# Patient Record
Sex: Male | Born: 1945 | Race: White | Hispanic: No | Marital: Married | Smoking: Former smoker
Health system: Southern US, Community
[De-identification: ages and names within clinical notes are randomized; demographics above are authoritative.]

## PROBLEM LIST (undated history)

## (undated) DIAGNOSIS — K449 Diaphragmatic hernia without obstruction or gangrene: Secondary | ICD-10-CM

## (undated) DIAGNOSIS — R42 Dizziness and giddiness: Secondary | ICD-10-CM

## (undated) DIAGNOSIS — E7800 Pure hypercholesterolemia, unspecified: Secondary | ICD-10-CM

## (undated) HISTORY — PX: BACK SURGERY: SHX140

## (undated) HISTORY — PX: SHOULDER SURGERY: SHX246

## (undated) HISTORY — PX: KNEE SURGERY: SHX244

---

## 1999-01-16 ENCOUNTER — Ambulatory Visit (HOSPITAL_COMMUNITY): Admission: RE | Admit: 1999-01-16 | Discharge: 1999-01-16 | Payer: Self-pay | Admitting: Internal Medicine

## 1999-04-17 ENCOUNTER — Ambulatory Visit (HOSPITAL_COMMUNITY): Admission: RE | Admit: 1999-04-17 | Discharge: 1999-04-17 | Payer: Self-pay | Admitting: *Deleted

## 2001-07-25 ENCOUNTER — Encounter (INDEPENDENT_AMBULATORY_CARE_PROVIDER_SITE_OTHER): Payer: Self-pay | Admitting: *Deleted

## 2001-07-25 ENCOUNTER — Other Ambulatory Visit: Admission: RE | Admit: 2001-07-25 | Discharge: 2001-07-25 | Payer: Self-pay | Admitting: Oncology

## 2005-09-05 ENCOUNTER — Ambulatory Visit: Payer: Self-pay | Admitting: Oncology

## 2006-09-03 ENCOUNTER — Ambulatory Visit: Payer: Self-pay | Admitting: Oncology

## 2006-09-05 LAB — CBC WITH DIFFERENTIAL/PLATELET
BASO%: 0.3 % (ref 0.0–2.0)
Eosinophils Absolute: 0.2 10*3/uL (ref 0.0–0.5)
LYMPH%: 18.3 % (ref 14.0–48.0)
MCHC: 35 g/dL (ref 32.0–35.9)
MONO#: 0.4 10*3/uL (ref 0.1–0.9)
NEUT#: 3.7 10*3/uL (ref 1.5–6.5)
RBC: 5.22 10*6/uL (ref 4.20–5.71)
RDW: 12 % (ref 11.2–14.6)
WBC: 5.3 10*3/uL (ref 4.0–10.0)
lymph#: 1 10*3/uL (ref 0.9–3.3)

## 2007-09-02 ENCOUNTER — Ambulatory Visit: Payer: Self-pay | Admitting: Oncology

## 2007-09-04 LAB — CBC WITH DIFFERENTIAL/PLATELET
BASO%: 0.4 % (ref 0.0–2.0)
Eosinophils Absolute: 0.3 10*3/uL (ref 0.0–0.5)
HCT: 44 % (ref 38.7–49.9)
HGB: 15.8 g/dL (ref 13.0–17.1)
MCHC: 36 g/dL — ABNORMAL HIGH (ref 32.0–35.9)
MONO#: 0.4 10*3/uL (ref 0.1–0.9)
NEUT#: 3.5 10*3/uL (ref 1.5–6.5)
NEUT%: 67.3 % (ref 40.0–75.0)
WBC: 5.2 10*3/uL (ref 4.0–10.0)
lymph#: 1 10*3/uL (ref 0.9–3.3)

## 2007-09-04 LAB — IGG, IGA, IGM: IgM, Serum: 16 mg/dL — ABNORMAL LOW (ref 60–263)

## 2008-08-31 ENCOUNTER — Ambulatory Visit: Payer: Self-pay | Admitting: Oncology

## 2008-09-03 LAB — CBC WITH DIFFERENTIAL/PLATELET
BASO%: 0.3 % (ref 0.0–2.0)
Basophils Absolute: 0 10*3/uL (ref 0.0–0.1)
EOS%: 3.9 % (ref 0.0–7.0)
HCT: 45.3 % (ref 38.7–49.9)
HGB: 15.7 g/dL (ref 13.0–17.1)
MCH: 31.7 pg (ref 28.0–33.4)
MONO#: 0.3 10*3/uL (ref 0.1–0.9)
NEUT%: 71.5 % (ref 40.0–75.0)
RDW: 12.2 % (ref 11.2–14.6)
WBC: 4.7 10*3/uL (ref 4.0–10.0)
lymph#: 0.8 10*3/uL — ABNORMAL LOW (ref 0.9–3.3)

## 2008-09-03 LAB — IGG, IGA, IGM
IgA: 38 mg/dL — ABNORMAL LOW (ref 68–378)
IgM, Serum: 13 mg/dL — ABNORMAL LOW (ref 60–263)

## 2009-09-07 ENCOUNTER — Ambulatory Visit: Payer: Self-pay | Admitting: Oncology

## 2009-09-09 LAB — CBC WITH DIFFERENTIAL/PLATELET
BASO%: 0.6 % (ref 0.0–2.0)
EOS%: 3.9 % (ref 0.0–7.0)
MCH: 30.9 pg (ref 27.2–33.4)
MCHC: 34.9 g/dL (ref 32.0–36.0)
MONO#: 0.4 10*3/uL (ref 0.1–0.9)
NEUT%: 70.5 % (ref 39.0–75.0)
RBC: 5.14 10*6/uL (ref 4.20–5.82)
RDW: 12.1 % (ref 11.0–14.6)
WBC: 5.4 10*3/uL (ref 4.0–10.3)
lymph#: 1 10*3/uL (ref 0.9–3.3)

## 2010-09-04 ENCOUNTER — Ambulatory Visit: Payer: Self-pay | Admitting: Oncology

## 2010-09-07 LAB — CBC WITH DIFFERENTIAL/PLATELET
BASO%: 0.3 % (ref 0.0–2.0)
EOS%: 4.3 % (ref 0.0–7.0)
HCT: 46.8 % (ref 38.4–49.9)
MCH: 31.1 pg (ref 27.2–33.4)
MCHC: 34.3 g/dL (ref 32.0–36.0)
MONO#: 0.4 10*3/uL (ref 0.1–0.9)
NEUT%: 71.2 % (ref 39.0–75.0)
RDW: 13.1 % (ref 11.0–14.6)
WBC: 4.9 10*3/uL (ref 4.0–10.3)
lymph#: 0.8 10*3/uL — ABNORMAL LOW (ref 0.9–3.3)

## 2010-09-08 LAB — IGG, IGA, IGM
IgA: 42 mg/dL — ABNORMAL LOW (ref 68–378)
IgG (Immunoglobin G), Serum: 498 mg/dL — ABNORMAL LOW (ref 694–1618)
IgM, Serum: 15 mg/dL — ABNORMAL LOW (ref 60–263)

## 2011-09-05 ENCOUNTER — Encounter (HOSPITAL_BASED_OUTPATIENT_CLINIC_OR_DEPARTMENT_OTHER): Payer: 59 | Admitting: Oncology

## 2011-09-05 ENCOUNTER — Other Ambulatory Visit: Payer: Self-pay | Admitting: Oncology

## 2011-09-05 DIAGNOSIS — D696 Thrombocytopenia, unspecified: Secondary | ICD-10-CM

## 2011-09-05 DIAGNOSIS — D801 Nonfamilial hypogammaglobulinemia: Secondary | ICD-10-CM

## 2011-09-05 LAB — CBC WITH DIFFERENTIAL/PLATELET
BASO%: 0.3 % (ref 0.0–2.0)
HCT: 47.3 % (ref 38.4–49.9)
MCHC: 34.4 g/dL (ref 32.0–36.0)
MONO#: 0.3 10*3/uL (ref 0.1–0.9)
NEUT#: 3.5 10*3/uL (ref 1.5–6.5)
RBC: 5.2 10*6/uL (ref 4.20–5.82)
WBC: 4.9 10*3/uL (ref 4.0–10.3)
lymph#: 0.9 10*3/uL (ref 0.9–3.3)

## 2011-09-06 LAB — IGG, IGA, IGM
IgG (Immunoglobin G), Serum: 437 mg/dL — ABNORMAL LOW (ref 650–1600)
IgM, Serum: 17 mg/dL — ABNORMAL LOW (ref 41–251)

## 2011-12-08 ENCOUNTER — Telehealth: Payer: Self-pay | Admitting: Oncology

## 2011-12-08 NOTE — Telephone Encounter (Signed)
Talked to pt, gave him appt date for 01/03/12 ,4 mos lab draw, he asked that lan for 1/10 be cancelled. He had a physical and labs were back to normal. He is faxing result to Dr Truett Perna. Gave pt appt for lab and md for September 2013

## 2012-01-03 ENCOUNTER — Other Ambulatory Visit: Payer: 59

## 2012-09-02 ENCOUNTER — Other Ambulatory Visit: Payer: Self-pay | Admitting: *Deleted

## 2012-09-02 ENCOUNTER — Other Ambulatory Visit: Payer: 59 | Admitting: Lab

## 2012-09-02 ENCOUNTER — Telehealth: Payer: Self-pay | Admitting: *Deleted

## 2012-09-02 ENCOUNTER — Ambulatory Visit: Payer: 59 | Admitting: Oncology

## 2012-09-02 NOTE — Telephone Encounter (Signed)
Left message with male at home # requesting pt call office. (Failed to keep appt today. Order sent to schedulers to r/s appt.)

## 2012-09-03 ENCOUNTER — Telehealth: Payer: Self-pay | Admitting: *Deleted

## 2012-09-03 NOTE — Telephone Encounter (Signed)
Called and spoke with pt regarding missed appt 09/02/12.  Pt stated "I simply forgot" and apologized. Referred pt to scheduling to make future appt.

## 2012-11-04 ENCOUNTER — Ambulatory Visit: Payer: 59 | Admitting: Oncology

## 2015-01-10 DIAGNOSIS — J019 Acute sinusitis, unspecified: Secondary | ICD-10-CM | POA: Diagnosis not present

## 2015-01-19 DIAGNOSIS — R42 Dizziness and giddiness: Secondary | ICD-10-CM | POA: Diagnosis not present

## 2015-01-19 DIAGNOSIS — Z125 Encounter for screening for malignant neoplasm of prostate: Secondary | ICD-10-CM | POA: Diagnosis not present

## 2015-01-19 DIAGNOSIS — Z1211 Encounter for screening for malignant neoplasm of colon: Secondary | ICD-10-CM | POA: Diagnosis not present

## 2015-01-19 DIAGNOSIS — I70219 Atherosclerosis of native arteries of extremities with intermittent claudication, unspecified extremity: Secondary | ICD-10-CM | POA: Diagnosis not present

## 2015-01-19 DIAGNOSIS — R1032 Left lower quadrant pain: Secondary | ICD-10-CM | POA: Diagnosis not present

## 2015-01-19 DIAGNOSIS — Z131 Encounter for screening for diabetes mellitus: Secondary | ICD-10-CM | POA: Diagnosis not present

## 2015-01-19 DIAGNOSIS — H9113 Presbycusis, bilateral: Secondary | ICD-10-CM | POA: Diagnosis not present

## 2015-01-19 DIAGNOSIS — E784 Other hyperlipidemia: Secondary | ICD-10-CM | POA: Diagnosis not present

## 2015-01-19 DIAGNOSIS — Z0000 Encounter for general adult medical examination without abnormal findings: Secondary | ICD-10-CM | POA: Diagnosis not present

## 2015-01-19 DIAGNOSIS — Z79899 Other long term (current) drug therapy: Secondary | ICD-10-CM | POA: Diagnosis not present

## 2015-01-19 DIAGNOSIS — E780 Pure hypercholesterolemia: Secondary | ICD-10-CM | POA: Diagnosis not present

## 2015-01-19 DIAGNOSIS — H8309 Labyrinthitis, unspecified ear: Secondary | ICD-10-CM | POA: Diagnosis not present

## 2015-01-19 DIAGNOSIS — Z23 Encounter for immunization: Secondary | ICD-10-CM | POA: Diagnosis not present

## 2015-01-19 DIAGNOSIS — Z1389 Encounter for screening for other disorder: Secondary | ICD-10-CM | POA: Diagnosis not present

## 2015-01-19 DIAGNOSIS — R002 Palpitations: Secondary | ICD-10-CM | POA: Diagnosis not present

## 2015-01-19 DIAGNOSIS — R5383 Other fatigue: Secondary | ICD-10-CM | POA: Diagnosis not present

## 2018-01-22 ENCOUNTER — Other Ambulatory Visit: Payer: Self-pay | Admitting: Internal Medicine

## 2018-01-22 DIAGNOSIS — R1310 Dysphagia, unspecified: Secondary | ICD-10-CM

## 2018-01-24 ENCOUNTER — Ambulatory Visit
Admission: RE | Admit: 2018-01-24 | Discharge: 2018-01-24 | Disposition: A | Discharge status: Home / Self Care | Payer: Medicare Other | Source: Ambulatory Visit | Attending: Internal Medicine | Admitting: Internal Medicine

## 2018-01-24 DIAGNOSIS — R1310 Dysphagia, unspecified: Secondary | ICD-10-CM

## 2019-12-22 ENCOUNTER — Encounter (HOSPITAL_COMMUNITY): Payer: Self-pay | Admitting: Emergency Medicine

## 2019-12-22 ENCOUNTER — Emergency Department (HOSPITAL_COMMUNITY)
Admission: EM | Admit: 2019-12-22 | Discharge: 2019-12-23 | Disposition: A | Discharge status: Home / Self Care | Payer: Medicare Other | Attending: Emergency Medicine | Admitting: Emergency Medicine

## 2019-12-22 ENCOUNTER — Other Ambulatory Visit: Payer: Self-pay

## 2019-12-22 DIAGNOSIS — R42 Dizziness and giddiness: Secondary | ICD-10-CM | POA: Insufficient documentation

## 2019-12-22 HISTORY — DX: Pure hypercholesterolemia, unspecified: E78.00

## 2019-12-22 HISTORY — DX: Dizziness and giddiness: R42

## 2019-12-22 HISTORY — DX: Diaphragmatic hernia without obstruction or gangrene: K44.9

## 2019-12-22 LAB — BASIC METABOLIC PANEL
Anion gap: 7 (ref 5–15)
BUN: 15 mg/dL (ref 8–23)
CO2: 28 mmol/L (ref 22–32)
Calcium: 9.9 mg/dL (ref 8.9–10.3)
Chloride: 107 mmol/L (ref 98–111)
Creatinine, Ser: 1.01 mg/dL (ref 0.61–1.24)
GFR calc Af Amer: >60 mL/min (ref >60)
GFR calc non Af Amer: >60 mL/min (ref >60)
Glucose, Bld: 139 mg/dL — ABNORMAL HIGH (ref 70–99)
Potassium: 4.5 mmol/L (ref 3.5–5.1)
Sodium: 142 mmol/L (ref 135–145)

## 2019-12-22 LAB — CBC
HCT: 50.4 % (ref 39.0–52.0)
Hemoglobin: 16.3 g/dL (ref 13.0–17.0)
MCH: 30.3 pg (ref 26.0–34.0)
MCHC: 32.3 g/dL (ref 30.0–36.0)
MCV: 93.7 fL (ref 80.0–100.0)
Platelets: 70 10*3/uL — ABNORMAL LOW (ref 150–400)
RBC: 5.38 MIL/uL (ref 4.22–5.81)
RDW: 11.9 % (ref 11.5–15.5)
WBC: 8 10*3/uL (ref 4.0–10.5)
nRBC: 0 % (ref 0.0–0.2)

## 2019-12-22 LAB — URINALYSIS, ROUTINE W REFLEX MICROSCOPIC
Bacteria, UA: NONE SEEN
Bilirubin Urine: NEGATIVE
Glucose, UA: NEGATIVE mg/dL
Hgb urine dipstick: NEGATIVE
Ketones, ur: NEGATIVE mg/dL
Nitrite: NEGATIVE
Protein, ur: NEGATIVE mg/dL
Specific Gravity, Urine: 1.009 (ref 1.005–1.030)
pH: 7 (ref 5.0–8.0)

## 2019-12-22 MED ORDER — SODIUM CHLORIDE 0.9% FLUSH: 3.0000 mL | Freq: Once | INTRAVENOUS | Status: AC

## 2019-12-22 NOTE — ED Notes (Signed)
Nurse Navigator spoke with daughter and she would like to make sure we are aware of pt has had some double vision as well as dizziness.

## 2019-12-22 NOTE — ED Triage Notes (Signed)
Pt had episode of dizziness on Sunday that resolved.  Hx of vertigo "a long time ago".  Today felt dizzy and had syncopal episode.  Slid down to ground.  Denies any injury.  Pt has history of hiatal hernia and had just ate prior to syncopal event.  Reports nausea and vomited x 1.  EMS administered Zofran 8mg  IV.

## 2019-12-23 ENCOUNTER — Emergency Department (HOSPITAL_COMMUNITY): Payer: Medicare Other

## 2019-12-23 MED ORDER — GADOBUTROL 1 MMOL/ML IV SOLN
9.0000 mL | Freq: Once | INTRAVENOUS | Status: AC | PRN
  Administered 2019-12-23: 9 mL via INTRAVENOUS

## 2019-12-23 MED ORDER — MECLIZINE HCL 12.5 MG PO TABS: 12.5000 mg | ORAL_TABLET | Freq: Three times a day (TID) | ORAL | 0 refills | Status: DC | PRN

## 2019-12-23 NOTE — ED Provider Notes (Signed)
[facility] Provider Note   CSN: [id number] Arrival date & time: 12/22/19  1434     History Chief Complaint  Patient presents with  . Loss of Consciousness  . Dizziness    MANN SKAGGS is a 73 y.o. male.  The history is provided by the patient.  Dizziness Quality:  Room spinning Severity:  Severe Onset quality:  Sudden Duration:  4 hours Timing:  Constant Progression:  Resolved Chronicity:  Recurrent Context comment:  Change in position Relieved by:  None tried Worsened by:  Nothing Associated symptoms: nausea and vomiting   Associated symptoms: no chest pain, no headaches, no shortness of breath and no vision changes   Patient with history of hyperlipidemia, ITP presents with episode of vertigo.  Patient reports recent history of vertigo that will occur at random, but also when he is turning his head. Today he had an episode with change of position that caused him to fall into his wife.  He reports the episode of dizziness lasted for up to 4 hours.  He reports this is the longest and most severe episode. Denies any traumatic injury from the fall Denies any new visual changes.  No focal weakness.  He did vomit after the dizziness started.  No chest pain or shortness of breath.  Denies history of stroke     Past Medical History:  Diagnosis Date  . Hiatal hernia   . High cholesterol   . Vertigo     There are no problems to display for this patient.   Past Surgical History:  Procedure Laterality Date  . BACK SURGERY    . KNEE SURGERY    . SHOULDER SURGERY         No family history on file.  Social History   Tobacco Use  . Smoking status: Never Smoker  . Smokeless tobacco: Never Used  Substance Use Topics  . Alcohol use: Not Currently  . Drug use: Never    Home Medications Prior to Admission medications   Not on File    Allergies    Patient has no allergy information on record.  Review of Systems     Review of Systems  Constitutional: Negative for fever.  Respiratory: Negative for shortness of breath.   Cardiovascular: Negative for chest pain.  Gastrointestinal: Positive for nausea and vomiting.  Neurological: Positive for dizziness. Negative for headaches.  All other systems reviewed and are negative.   Physical Exam Updated Vital Signs BP (!) 153/91   Pulse 65   Temp 97.8 F (36.6 C) (Oral)   Resp 15   SpO2 95%   Physical Exam CONSTITUTIONAL: Elderly, no distress HEAD: Normocephalic/atraumatic EYES: EOMI/PERRL, no nystagmus, no visual field deficit  no ptosis ENMT: Mucous membranes moist NECK: supple no meningeal signs, no bruits CV: S1/S2 noted, no murmurs/rubs/gallops noted LUNGS: Lungs are clear to auscultation bilaterally, no apparent distress ABDOMEN: soft, nontender NEURO:Awake/alert, face symmetric, no arm or leg drift is noted Equal 5/5 strength with shoulder abduction, elbow flex/extension, wrist flex/extension in upper extremities and equal hand grips bilaterally Equal 5/5 strength with hip flexion,knee flex/extension, foot dorsi/plantar flexion Cranial nerves 3/4/5/6/07/01/09/11/12 tested and intact Gait normal without ataxia No past pointing Sensation to light touch intact in all extremities EXTREMITIES: pulses normal, full ROM, no visible trauma SKIN: warm, color normal PSYCH: no abnormalities of mood noted  ED Results / Procedures / Treatments   Labs (all labs ordered are listed, but only abnormal results are displayed) Labs  Reviewed  BASIC METABOLIC PANEL - Abnormal; Notable for the following components:      Result Value   Glucose, Bld 139 (*)    All other components within normal limits  CBC - Abnormal; Notable for the following components:   Platelets 70 (*)    All other components within normal limits  URINALYSIS, ROUTINE W REFLEX MICROSCOPIC - Abnormal; Notable for the following components:   Leukocytes,Ua TRACE (*)    All other components  within normal limits    EKG EKG Interpretation  Date/Time:  Tuesday December 22 2019 14:44:42 EST Ventricular Rate:  67 PR Interval:  216 QRS Duration: 112 QT Interval:  406 QTC Calculation: 429 R Axis:   34 Text Interpretation: Sinus rhythm with 1st degree A-V block Otherwise normal ECG No previous ECGs available Confirmed by Zadie Rhine ([id number]) on 12/23/2019 12:37:25 AM   Radiology No results found.  Procedures Procedures   Medications Ordered in ED Medications  sodium chloride flush (NS) 0.9 % injection 3 mL (3 mLs Intravenous Given by EMS 12/23/19 3545)    ED Course  I have reviewed the triage vital signs and the nursing notes.  Pertinent labs   results that were available during my care of the patient were reviewed by me and considered in my medical decision making (see chart for details).    MDM Rules/Calculators/A&P                      1:00 AM Patient presents after a significant episode of vertigo that caused him to fall. He reports he has had this recently with increasing frequency. Concerned about occult stroke or even vertebrobasilar insufficiency Discussed the case with neuroradiologist Dr. Phill Myron, he recommends MRA head without contrast, MRA neck with and without contrast At this time patient is feeling improved and no focal weakness. EKG shows mild first-degree block but no other acute findings He does have a history of thrombocytopenia, but otherwise labs unremarkable 6:48 AM Patient stable, awaiting MRI imaging. No new complaints. Will sign out to Dr. Silverio Lay at shift change to follow-up MRI Final Clinical Impression(s) / ED Diagnoses Final diagnoses:  None    Rx / DC Orders ED Discharge Orders    None       Zadie Rhine, MD 12/23/19 [phone]

## 2019-12-23 NOTE — ED Notes (Signed)
Patient's daughters: Gonzella Lex VOHYWV : Marthenia Rolling - requesting update on patient's condition .

## 2019-12-23 NOTE — ED Notes (Signed)
Patient transported to MRI 

## 2019-12-23 NOTE — ED Notes (Addendum)
Patient is resting comfortably. 

## 2019-12-23 NOTE — ED Notes (Signed)
Patient verbalizes understanding of discharge instructions. Opportunity for questioning and answers were provided. Armband removed by staff, pt discharged from ED.  

## 2019-12-23 NOTE — ED Provider Notes (Signed)
  Physical Exam  BP 133/79   Pulse 64   Temp 97.8 F (36.6 C) (Oral)   Resp 18   Ht 5\' 11"  (1.803 m)   Wt 90.7 kg   SpO2 98%   BMI 27.89 kg/m   Physical Exam  ED Course/Procedures     Procedures  MDM  Care assumed from Dr. Christy Gentles at 7 AM.  Patient has been having dizzy episodes and was worse yesterday.  He has nonfocal neuro exam at that time an MRI brain and MRA head and neck were ordered.   11:14 AM MRI showed previous strokes. Patient has hx of ITP so will hold off on any antiplatelet agents.  He has multiple family members who have died of massive stroke so he is very concerned.  We will start meclizine for symptomatic relief and refer him to neurology.    Drenda Freeze, MD 12/23/19 [phone]

## 2019-12-23 NOTE — Discharge Instructions (Addendum)
Take meclizine as needed for dizziness  You have previous strokes on the MRI but none recently   See neurologist for follow up   Return to ER if you have worse dizziness, trouble speaking, weakness

## 2019-12-23 NOTE — ED Notes (Addendum)
Vital signs stable. 

## 2019-12-30 ENCOUNTER — Ambulatory Visit
Admission: RE | Admit: 2019-12-30 | Discharge: 2019-12-30 | Disposition: A | Discharge status: Home / Self Care | Payer: Medicare Other | Source: Ambulatory Visit | Attending: Chiropractor | Admitting: Chiropractor

## 2019-12-30 ENCOUNTER — Other Ambulatory Visit: Payer: Self-pay | Admitting: Chiropractor

## 2019-12-30 DIAGNOSIS — M542 Cervicalgia: Secondary | ICD-10-CM

## 2020-01-21 ENCOUNTER — Ambulatory Visit: Payer: Medicare Other

## 2020-01-30 ENCOUNTER — Ambulatory Visit: Payer: Medicare Other | Attending: Internal Medicine

## 2020-01-30 DIAGNOSIS — Z23 Encounter for immunization: Secondary | ICD-10-CM | POA: Insufficient documentation

## 2020-01-30 NOTE — Progress Notes (Signed)
   Covid-19 Vaccination Clinic  Name:  Anthony Reilly    MRN: [id number] DOB: 1946/12/14  01/30/2020  [name] was observed post Covid-19 immunization for 15 minutes without incidence. He was provided with Vaccine Information Sheet and instruction to access the V-Safe system.   [name] was instructed to call 911 with any severe reactions post vaccine: Marland Kitchen Difficulty breathing  . Swelling of your face and throat  . A fast heartbeat  . A bad rash all over your body  . Dizziness and weakness    Immunizations Administered    Name Date Dose VIS Date Route   Pfizer COVID-19 Vaccine 01/30/2020 10:14 AM 0.3 mL 12/04/2019 Intramuscular   Manufacturer: [employer], [employer]   Lot: [id number]   NDC: [address]-5

## 2020-02-04 ENCOUNTER — Ambulatory Visit: Payer: Medicare Other | Admitting: Neurology

## 2020-02-04 ENCOUNTER — Other Ambulatory Visit: Payer: Self-pay

## 2020-02-04 ENCOUNTER — Encounter: Payer: Self-pay | Admitting: Neurology

## 2020-02-04 VITALS — Ht 71.0 in | Wt 204.0 lb

## 2020-02-04 DIAGNOSIS — D693 Immune thrombocytopenic purpura: Secondary | ICD-10-CM | POA: Diagnosis not present

## 2020-02-04 DIAGNOSIS — R42 Dizziness and giddiness: Secondary | ICD-10-CM | POA: Diagnosis not present

## 2020-02-04 DIAGNOSIS — I6381 Other cerebral infarction due to occlusion or stenosis of small artery: Secondary | ICD-10-CM | POA: Diagnosis not present

## 2020-02-04 NOTE — Progress Notes (Signed)
[facility]    Provider:  Dr Lucia Gaskins Requesting Provider: Emergency Room Primary Care Provider:  Burton Apley, MD  CC:  dizziness  HPI:  Anthony Reilly is a 74 y.o. male here as requested by Burton Apley, MD for dizzines. PMHx vertigo, high cholesterol. Patient having dizzy spells, was seen at th emergency room and MRA showed remote strokes. Vertigo started 5-7 years ago he bent down and the room started spinning, since then he has had multiple episodes one most recently December 29th which was severe and sent him to the emergency room. He was started on Plavix recently after stroke found on MRI of the brain.  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  He is worn glasses for over 30 years, 18 months ago he began having occasional dizzy spells about the time I received new prescription glasses from the [place], glasses never seemed right, his first 2020 series vertigo incident occurred in January February, he was seated at a card table within a matter minute he suddenly developed blurred vision dizziness, he was recovered in about 30 minutes, since then he has had a dozen or more dizzy spells of varying degrees, usually occurring when he stood from a seated position but not always, at least twice of occurred when he cut his eyes sharply to the left while checking for oncoming vehicular traffic, resolved in 10 minutes or less, sometime around mid December 2020 he was reading for a book he was wearing his glasses, he gaze across the room experienced  severe dizziness, corrected itself than 10 minutes, on Sunday, December 26 he experienced a mild case of dizziness just after he sat down in a chair which subsided in less than 5 minutes is worse vertigo attack by far was on Tuesday, December 22, 2019, he stood up, immediately experienced severe dizziness and started across the room and grabbed a TV consult for support, everything was spinning, he fell hard to the floor, paramedic was called, his blood pressure was elevated, they were transported to the [facility] ER, MRI of the brain showed a "silent stroke" he was actually told there were multiple of them in the emergency room.  We also reviewed all the information above during the appointment, patient also states in the appointment today he is never experienced anybody weakness, focal weakness, and is concerned because strokes run on his mother side as do Alzheimer's disease, also maternal uncles died from strokes at age 29 and 46.  He also reports he was diagnosed with ITP several years ago, he has some muscular neck disease, takes Aleve to combat the neck soreness, he is currently seeing chiropractor for this.  On January 2021 he had an eye exam, and they told him that it may be due to his eyeglasses and he needed a "prism", since those new classes he has had no incidence of vertigo and feels as though this may have been contributory as well as his blood pressure medication which has been changed. No other focal neurologic deficits,  associated symptoms, inciting events or modifiable factors.  Reviewed notes, labs and imaging from outside physicians, which showed:  Personally reviewed imaging of the brain which showed a remote lacunar infarct of the left caudate, incidental (not the cause of vertigo). I also reviewed images with Dr. Pearlean Brownie, head of Stroke, who agreed.  Review of Systems: Patient complains of symptoms per HPI as well as the following symptoms: vertigo, elevated blood pressure. Pertinent  negatives and positives per HPI. All others negative.   Social History   Socioeconomic History  . Marital status: Married    Spouse name: Not on file  . Number of children: 2  . Years of education: 34  . Highest education level: Not on file  Occupational History  . Not on file  Tobacco Use  . Smoking status: Former Smoker    Types: Cigarettes  . Smokeless tobacco: Never Used  . Tobacco comment: smoked maybe a carton in his 10s so he quit.  Substance and Sexual Activity  . Alcohol use: Not Currently  . Drug use: Never  . Sexual activity: Not on file  Other Topics Concern  . Not on file  Social History Narrative   Lives at home with wife   Right handed   Caffeine: 3-4 diet pepsi cans per day   Social Determinants of Health   Financial Resource Strain:   . Difficulty of Paying Living Expenses: Not on file  Food Insecurity:   . Worried About Programme researcher, broadcasting/film/video in the Last Year: Not on file  . Ran Out of Food in the Last Year: Not on file  Transportation Needs:   . Lack of Transportation (Medical): Not on file  . Lack of Transportation (Non-Medical): Not on file  Physical Activity:   . Days of Exercise per Week: Not on file  . Minutes of Exercise per Session: Not on file  Stress:   . Feeling of Stress : Not on file  Social Connections:   . Frequency of Communication with Friends and Family: Not on file  . Frequency of Social Gatherings with Friends and Family: Not on file  . Attends Religious Services: Not on file  . Active Member of Clubs or Organizations: Not on file  . Attends Banker Meetings: Not on file  . Marital Status: Not on file  Intimate Partner Violence:   . Fear of Current or Ex-Partner: Not on file  . Emotionally Abused: Not on file  . Physically Abused: Not on file  . Sexually Abused: Not on file    Family History  Problem Relation Age of Onset  . Alzheimer's disease Mother   . Stroke Maternal Grandmother   . Alzheimer's disease  Maternal Grandmother   . Stroke Maternal Uncle   . Stroke Maternal Uncle     Past Medical History:  Diagnosis Date  . Hiatal hernia   . High cholesterol   . Vertigo     Patient Active Problem List   Diagnosis Date Noted  . Lacunar stroke (HCC) 02/08/2020  . 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 Vertigo 02/08/2020    Past Surgical History:  Procedure Laterality Date  . BACK SURGERY    . KNEE SURGERY    . SHOULDER SURGERY      Current Outpatient Medications  Medication Sig Dispense Refill  . clopidogrel (PLAVIX) 75 MG tablet Take 75 mg by mouth daily.    . meclizine (ANTIVERT) 25 MG tablet Take 25 mg by mouth 3 (three) times  daily as needed.    Marland Kitchen omeprazole (PRILOSEC) 40 MG capsule Take 40 mg by mouth every other day.     . rosuvastatin (CRESTOR) 5 MG tablet Take 10 mg by mouth daily.     No current facility-administered medications for this visit.    Allergies as of 02/04/2020 - Review Complete 02/04/2020  Allergen Reaction Noted  . Penicillins Hives 12/23/2019    Vitals: Ht 5\' 11"  (1.803 m)   Wt 204 lb (92.5 kg)   BMI 28.45 kg/m  Last Weight:  Wt Readings from Last 1 Encounters:  02/04/20 204 lb (92.5 kg)   Last Height:   Ht Readings from Last 1 Encounters:  02/04/20 5\' 11"  (1.803 m)   Blood pressure 124/73 pulse 68 sitting, standing 142/78 pulse 71, standing for 3 minutes 129/77 pulse 65  Physical exam: Exam: Gen: NAD, conversant, well nourised, well groomed                     CV: RRR, no MRG. No Carotid Bruits. No peripheral edema, warm, nontender Eyes: Conjunctivae clear without exudates or hemorrhage  Neuro: Detailed Neurologic Exam  Speech:    Speech is normal; fluent and spontaneous with normal comprehension.  Cognition:    The patient is oriented to person, place, and time;     recent and remote memory intact;     language fluent;     normal attention, concentration,     fund of knowledge Cranial Nerves:    The  pupils are equal, round, and reactive to light. . Visual fields are full to finger confrontation. Extraocular movements are intact. Trigeminal sensation is intact and the muscles of mastication are normal. The face is symmetric. The palate elevates in the midline. Hearing intact. Voice is normal. Shoulder shrug is normal. The tongue has normal motion without fasciculations.   Coordination:    Normal FTN, HTS  Gait:    Heel-toe and tandem gait are normal.   Motor Observation:    No asymmetry, no atrophy, and no involuntary movements noted. Tone:    Normal muscle tone.    Posture:    Posture is normal. normal erect    Strength:    Strength is V/V in the upper and lower limbs.      Sensation: intact to LT     Reflex Exam:  DTR's:    Deep tendon reflexes in the upper and lower extremities are symmetrical bilaterally.   Toes:    The toes are equivocal bilaterally.   Clonus:    Clonus is absent.    Assessment/Plan: 12 74 year old here for episodes of vertigo.  Personally reviewed imaging of the brain which showed a remote lacunar infarct of the left caudate, incidental (not the cause of vertigo). I also reviewed images with Dr. Leonie Man, head of Stroke, who agreed.  We will complete the stroke work-up, recommended LDL less than 70, will check a hemoglobin A1c for completeness, he has had evaluation of his blood vessels in the head and neck which did not show any significant stenosis or evidence of dissection (reviewed report 12/23/2019).  He will need an echocardiogram with bubble study to complete the stroke work-up.  Continue Plavix.  Continue to follow-up with primary care for management of vascular risk factors.  Orders Placed This Encounter  Procedures  . Hemoglobin A1c  . ECHOCARDIOGRAM COMPLETE BUBBLE STUDY   I had a long d/w patient about remote stroke, risk for recurrent stroke/TIAs, personally independently reviewed imaging  studies and stroke evaluation results and answered  questions.Continue Plavix for secondary stroke prevention and maintain strict control of hypertension with blood pressure goal below 130/90, diabetes with hemoglobin A1c goal below 6.5% and lipids with LDL cholesterol goal below 70 mg/dL. I also advised the patient to eat a healthy diet with plenty of whole grains, cereals, fruits and vegetables, exercise regularly and maintain ideal body weight .Followup in the future as needed.     Cc: Burton Apley, MD  Naomie Dean, MD  [facility] [address] [place], [place] [address]  Phone [phone] Fax [phone]

## 2020-02-04 NOTE — Patient Instructions (Signed)
Blood work Echocardiogram    Lacunar Stroke  A stroke is the sudden death of brain tissue that occurs when an area of the brain does not get enough oxygen. A lacunar stroke (lacunar infarction) is caused by a blockage in one of the small arteries deep in the brain. Lacunar stroke is a medical emergency that must be treated right away.It is important to get help right away as soon as you notice symptoms of stroke. What are the causes? A lacunar stroke occurs when small arteries deep in the brain become more narrow due to a buildup of fatty deposits in the arteries (atherosclerosis). When the arteries narrow, less blood flows to certain areas of the brain. Without enough blood and oxygen, these parts of the brain can die or become permanently damaged. What increases the risk? You are more likely to develop this condition if:  You have high blood pressure (hypertension).  You have high cholesterol (hyperlipidemia).  You smoke.  You have diabetes.  You are obese.  You have an unhealthy diet. This includes foods that are high in saturated fat, trans fat, and salt (sodium).  You have a heart rhythm disorder (atrial fibrillation).  You have heart disease.  You have artery disease, such as carotid artery disease or peripheral artery disease.  You have sickle cell disease.  You are age 74 or older.  You have a personal or family history of stroke.  You are African-American.  You are a woman who: ? Is pregnant. ? Has a history of diabetes during pregnancy (gestational diabetes). ? Has a history of preeclampsia or eclampsia. ? Has had hormone therapy after menopause. ? Uses oral birth control (contraception), especially while smoking. What are the signs or symptoms? Symptoms of this condition usually develop suddenly. They may include:  Weakness or numbness in your face, arm, or leg, especially on one side of your body.  Trouble walking or difficulty moving your arms or  legs.  Loss of balance or coordination.  Confusion.  Slurred speech (dysarthria).  Trouble speaking, understanding speech, or both (aphasia).  Vision problems in one or both eyes.  Dizziness.  Nausea and vomiting.  Severe headache with no known cause. How is this diagnosed? This condition may be diagnosed based on:  Your symptoms and medical history.  A physical exam.  Blood tests.  A CT scan of the brain.  MRI.  Ultrasound of an artery. This may help find blood flow problems or blockages.  Angiogram. During this test, dye is injected into your blood and then an X-ray is done to look for blockages. The dye helps blood flow and blockages show up clearly on X-rays.  Electroencephalogram (EEG). This test checks electrical activity in the brain. How is this treated? This condition must be treated within 4.5 hours of the start of the stroke. It is treated with IV medicine that dissolves the blood clot (tissue plasminogen activator, TPA) that is causing the blockage. The goal is to restore blood flow to the brain as soon as possible. Your health care provider may prescribe blood thinners (antiplatelets or anticoagulants) to lower your risk of another stroke. Follow these instructions at home: Medicines  Take over-the-counter and other prescription medicines only as told by your health care provider. This includes diabetes or cholesterol medicine.  If you were told to take a medicine to thin your blood, such as aspirin, take it exactly as told by your health care provider. ? Taking too much blood-thinning medicine can cause bleeding. ?  If you do not take enough blood-thinning medicine, you will not have the protection that you need against another stroke and other problems. Eating and drinking  Follow instructions from your health care provider about eating and drinking restrictions.  Eat a healthy diet. This includes plenty of fruits and vegetables, lean meats, whole  grains, and low-fat dairy products.  Avoid foods high in saturated fat, trans fat, or sodium.  Limit alcohol intake to no more than 1 drink a day for nonpregnant women and 2 drinks a day for men. One drink equals 12 oz of beer, 5 oz of wine, or 1 oz of hard liquor. Safety  If you need help walking, use a cane or walker as told by your health care provider.  Take steps to lower the risk of falls in your home. This may include: ? Using safety equipment, such as raised toilets and a seat in the shower. ? Removing clutter and tripping hazards from walkways, such as cords or rugs. ? Installing grab bars in the bedroom and bathroom. Activity  Exercise regularly, as told by your health care provider.  Take part in rehabilitation programs as told by your health care provider. This may include physical therapy, occupational therapy, or speech therapy. General instructions  Do not use any tobacco products, such as cigarettes, chewing tobacco, and e-cigarettes. If you need help quitting, ask your health care provider.  Keep all follow-up visits as told by your health care provider. This is important. Get help right away if:   You have any symptoms of stroke. "BE FAST" is an easy way to remember the main warning signs of stroke: ? B - Balance. Signs are dizziness, sudden trouble walking, or loss of balance. ? E - Eyes. Signs are trouble seeing or a sudden change in vision. ? F - Face. Signs are sudden weakness or numbness of the face, or the face or eyelid drooping on one side. ? A - Arms. Signs are weakness or numbness in an arm. This happens suddenly and usually on one side of the body. ? S - Speech. Signs are sudden trouble speaking, slurred speech, or trouble understanding what people say. ? T - Time. Time to call emergency services. Write down what time symptoms started.  You have other signs of stroke, such as: ? A sudden, severe headache with no known cause. ? Nausea or  vomiting. ? Seizure.  You have a severe fall or injury. These symptoms may represent a serious problem that is an emergency. Do not wait to see if the symptoms will go away. Get medical help right away. Call your local emergency services (911 in the [place] Do not drive yourself to the hospital. Summary  A lacunar stroke (lacunar infarction) is a blockage of one of the small arteries deep in the brain. When one of these arteries is blocked, parts of the brain do not get enough oxygen and may die.  This condition is a medical emergency that must be treated right away. Treatments must be done within 4.5 hours of the start of the stroke.  Controlling your risk factors for stroke is the best way to avoid another lacunar stroke.  Get help right away if you have any symptoms of stroke. "BE FAST" is an easy way to remember the main warning signs of stroke. This information is not intended to replace advice given to you by your health care provider. Make sure you discuss any questions you have with your health care provider. Document  Revised: 08/29/2018 Document Reviewed: 04/26/2017 Elsevier Patient Education  2020 [employer].

## 2020-02-05 LAB — HEMOGLOBIN A1C
Est. average glucose Bld gHb Est-mCnc: 108 mg/dL
Hgb A1c MFr Bld: 5.4 % (ref 4.8–5.6)

## 2020-02-08 ENCOUNTER — Encounter: Payer: Self-pay | Admitting: Neurology

## 2020-02-08 DIAGNOSIS — D693 Immune thrombocytopenic purpura: Secondary | ICD-10-CM | POA: Insufficient documentation

## 2020-02-08 DIAGNOSIS — I6381 Other cerebral infarction due to occlusion or stenosis of small artery: Secondary | ICD-10-CM | POA: Insufficient documentation

## 2020-02-08 DIAGNOSIS — R42 Dizziness and giddiness: Secondary | ICD-10-CM | POA: Insufficient documentation

## 2020-02-24 ENCOUNTER — Ambulatory Visit: Payer: Medicare Other | Attending: Internal Medicine

## 2020-02-24 DIAGNOSIS — Z23 Encounter for immunization: Secondary | ICD-10-CM | POA: Insufficient documentation

## 2020-02-24 NOTE — Progress Notes (Signed)
   Covid-[facility] Vaccination [facility]  Name:  Anthony Reilly    MRN: [id number] DOB: 1946/09/16  02/24/2020  Anthony Reilly was observed post Covid-19 immunization for 15 minutes without incident. He was provided with Vaccine Information Sheet and instruction to access the V-Safe system.   Anthony Reilly was instructed to call 911 with any severe reactions post vaccine: Marland Kitchen Difficulty breathing  . Swelling of face and throat  . A fast heartbeat  . A bad rash all over body  . Dizziness and weakness   Immunizations Administered    Name Date Dose VIS Date Route   Pfizer COVID-19 Vaccine 02/24/2020  8:41 AM 0.3 mL 12/04/2019 Intramuscular   Manufacturer: [employer], [employer]   Lot: [id number]   NDC: [address]-6

## 2020-03-07 ENCOUNTER — Ambulatory Visit (HOSPITAL_COMMUNITY): Payer: Medicare Other | Attending: Internal Medicine

## 2020-03-07 ENCOUNTER — Other Ambulatory Visit: Payer: Self-pay

## 2020-03-07 DIAGNOSIS — I6381 Other cerebral infarction due to occlusion or stenosis of small artery: Secondary | ICD-10-CM | POA: Diagnosis not present

## 2020-03-07 MED ORDER — SODIUM CHLORIDE 0.9% FLUSH: 18.0000 mL | INTRAVENOUS | Status: AC | PRN

## 2021-09-25 ENCOUNTER — Encounter: Payer: Self-pay | Admitting: Neurology

## 2021-09-25 ENCOUNTER — Ambulatory Visit: Payer: Medicare Other | Admitting: Neurology

## 2021-09-25 VITALS — BP 143/82 | HR 62 | Ht 71.0 in | Wt 204.0 lb

## 2021-09-25 DIAGNOSIS — R200 Anesthesia of skin: Secondary | ICD-10-CM

## 2021-09-25 DIAGNOSIS — G6289 Other specified polyneuropathies: Secondary | ICD-10-CM | POA: Diagnosis not present

## 2021-09-25 DIAGNOSIS — R202 Paresthesia of skin: Secondary | ICD-10-CM | POA: Diagnosis not present

## 2021-09-25 NOTE — Patient Instructions (Addendum)
- Stop Statin. If the symptoms resolved, this would the second time statins have caused sensory changes in the distal extremities and I would recommend trying Repatha or Praluent - Some blood work for other cause - EMG/NCS 4-6 weeks  Electromyoneurogram Electromyoneurogram is a test to check how well your muscles and nerves are working. This procedure includes the combined use of electromyogram (EMG) and nerve conduction study (NCS). EMG is used to look for muscular disorders. NCS, which is also called electroneurogram, measures how well your nerves are controlling your muscles. The procedures are usually done together to check if your muscles and nerves are healthy. If the results of the tests are abnormal, this may indicate disease or injury, such as a neuromuscular disease or peripheral nerve damage. Tell a health care provider about: Any allergies you have. All medicines you are taking, including vitamins, herbs, eye drops, creams, and over-the-counter medicines. Any problems you or family members have had with anesthetic medicines. Any blood disorders you have. Any surgeries you have had. Any medical conditions you have. If you have a pacemaker. Whether you are pregnant or may be pregnant. What are the risks? Generally, this is a safe procedure. However, problems may occur, including: Infection where the electrodes were inserted. Bleeding. What happens before the procedure? Medicines Ask your health care provider about: Changing or stopping your regular medicines. This is especially important if you are taking diabetes medicines or blood thinners. Taking medicines such as aspirin and ibuprofen. These medicines can thin your blood. Do not take these medicines unless your health care provider tells you to take them. Taking over-the-counter medicines, vitamins, herbs, and supplements. General instructions Your health care provider may ask you to avoid: Beverages that have caffeine, such  as coffee and tea. Any products that contain nicotine or tobacco. These products include cigarettes, e-cigarettes, and chewing tobacco. If you need help quitting, ask your health care provider. Do not use lotions or creams on the same day that you will be having the procedure. What happens during the procedure? For EMG  Your health care provider will ask you to stay in a position so that he or she can access the muscle that will be studied. You may be standing, sitting, or lying down. You may be given a medicine that numbs the area (local anesthetic). A very thin needle that has an electrode will be inserted into your muscle. Another small electrode will be placed on your skin near the muscle. Your health care provider will ask you to continue to remain still. The electrodes will send a signal that tells about the electrical activity of your muscles. You may see this on a monitor or hear it in the room. After your muscles have been studied at rest, your health care provider will ask you to contract or flex your muscles. The electrodes will send a signal that tells about the electrical activity of your muscles. Your health care provider will remove the electrodes and the electrode needles when the procedure is finished. The procedure may vary among health care providers and hospitals. For NCS  An electrode that records your nerve activity (recording electrode) will be placed on your skin by the muscle that is being studied. An electrode that is used as a reference (reference electrode) will be placed near the recording electrode. A paste or gel will be applied to your skin between the recording electrode and the reference electrode. Your nerve will be stimulated with a mild shock. Your health care provider  will measure how much time it takes for your muscle to react. Your health care provider will remove the electrodes and the gel when the procedure is finished. The procedure may vary among  health care providers and hospitals. What happens after the procedure? It is up to you to get the results of your procedure. Ask your health care provider, or the department that is doing the procedure, when your results will be ready. Your health care provider may: Give you medicines for any pain. Monitor the insertion sites to make sure that bleeding stops. Summary Electromyoneurogram is a test to check how well your muscles and nerves are working. If the results of the tests are abnormal, this may indicate disease or injury. This is a safe procedure. However, problems may occur, such as bleeding and infection. Your health care provider will do two tests to complete this procedure. One checks your muscles (EMG) and another checks your nerves (NCS). It is up to you to get the results of your procedure. Ask your health care provider, or the department that is doing the procedure, when your results will be ready. This information is not intended to replace advice given to you by your health care provider. Make sure you discuss any questions you have with your health care provider. Document Revised: 08/26/2018 Document Reviewed: 08/08/2018 Elsevier Patient Education  2022 [employer]   Peripheral Neuropathy Peripheral neuropathy is a type of nerve damage. It affects nerves that carry signals between the spinal cord and the arms, legs, and the rest of the body (peripheral nerves). It does not affect nerves in the spinal cord or brain. In peripheral neuropathy, one nerve or a group of nerves may be damaged. Peripheral neuropathy is a broad category that includes many specific nerve disorders, like diabetic neuropathy, hereditary neuropathy, and carpal tunnel syndrome. What are the causes? This condition may be caused by: Diabetes. This is the most common cause of peripheral neuropathy. Nerve injury. Pressure or stress on a nerve that lasts a long time. Lack (deficiency) of B vitamins. This can  result from alcoholism, poor diet, or a restricted diet. Infections. Autoimmune diseases, such as rheumatoid arthritis and systemic lupus erythematosus. Nerve diseases that are passed from parent to child (inherited). Some medicines, such as cancer medicines (chemotherapy). Poisonous (toxic) substances, such as lead and mercury. Too little blood flowing to the legs. Kidney disease. Thyroid disease. In some cases, the cause of this condition is not known. What are the signs or symptoms? Symptoms of this condition depend on which of your nerves is damaged. Common symptoms include: Loss of feeling (numbness) in the feet, hands, or both. Tingling in the feet, hands, or both. Burning pain. Very sensitive skin. Weakness. Not being able to move a part of the body (paralysis). Muscle twitching. Clumsiness or poor coordination. Loss of balance. Not being able to control your bladder. Feeling dizzy. Sexual problems. How is this diagnosed? Diagnosing and finding the cause of peripheral neuropathy can be difficult. Your health care provider will take your medical history and do a physical exam. A neurological exam will also be done. This involves checking things that are affected by your brain, spinal cord, and nerves (nervous system). For example, your health care provider will check your reflexes, how you move, and what you can feel. You may have other tests, such as: Blood tests. Electromyogram (EMG) and nerve conduction tests. These tests check nerve function and how well the nerves are controlling the muscles. Imaging tests, such as  CT scans or MRI to rule out other causes of your symptoms. Removing a small piece of nerve to be examined in a lab (nerve biopsy). Removing and examining a small amount of the fluid that surrounds the brain and spinal cord (lumbar puncture). How is this treated? Treatment for this condition may involve: Treating the underlying cause of the neuropathy, such as  diabetes, kidney disease, or vitamin deficiencies. Stopping medicines that can cause neuropathy, such as chemotherapy. Medicine to help relieve pain. Medicines may include: Prescription or over-the-counter pain medicine. Antiseizure medicine. Antidepressants. Pain-relieving patches that are applied to painful areas of skin. Surgery to relieve pressure on a nerve or to destroy a nerve that is causing pain. Physical therapy to help improve movement and balance. Devices to help you move around (assistive devices). Follow these instructions at home: Medicines Take over-the-counter and prescription medicines only as told by your health care provider. Do not take any other medicines without first asking your health care provider. Do not drive or use heavy machinery while taking prescription pain medicine. Lifestyle  Do not use any products that contain nicotine or tobacco, such as cigarettes and e-cigarettes. Smoking keeps blood from reaching damaged nerves. If you need help quitting, ask your health care provider. Avoid or limit alcohol. Too much alcohol can cause a vitamin B deficiency, and vitamin B is needed for healthy nerves. Eat a healthy diet. This includes: Eating foods that are high in fiber, such as fresh fruits and vegetables, whole grains, and beans. Limiting foods that are high in fat and processed sugars, such as fried or sweet foods. General instructions  If you have diabetes, work closely with your health care provider to keep your blood sugar under control. If you have numbness in your feet: Check every day for signs of injury or infection. Watch for redness, warmth, and swelling. Wear padded socks and comfortable shoes. These help protect your feet. Develop a good support system. Living with peripheral neuropathy can be stressful. Consider talking with a mental health specialist or joining a support group. Use assistive devices and attend physical therapy as told by your  health care provider. This may include using a walker or a cane. Keep all follow-up visits as told by your health care provider. This is important. Contact a health care provider if: You have new signs or symptoms of peripheral neuropathy. You are struggling emotionally from dealing with peripheral neuropathy. Your pain is not well-controlled. Get help right away if: You have an injury or infection that is not healing normally. You develop new weakness in an arm or leg. You have fallen or do so frequently. Summary Peripheral neuropathy is when the nerves in the arms, or legs are damaged, resulting in numbness, weakness, or pain. There are many causes of peripheral neuropathy, including diabetes, pinched nerves, vitamin deficiencies, autoimmune disease, and hereditary conditions. Diagnosing and finding the cause of peripheral neuropathy can be difficult. Your health care provider will take your medical history, do a physical exam, and do tests, including blood tests and nerve function tests. Treatment involves treating the underlying cause of the neuropathy and taking medicines to help control pain. Physical therapy and assistive devices may also help. This information is not intended to replace advice given to you by your health care provider. Make sure you discuss any questions you have with your health care provider. Document Revised: 09/20/2020 Document Reviewed: 09/20/2020 Elsevier Patient Education  2022 [employer].

## 2021-09-25 NOTE — Progress Notes (Signed)
[facility]    Provider:  Dr Jaynee Eagles Requesting Provider: Emergency Room Primary Care Provider:  Lorene Dy, MD  CC:  Pain in the feet  Internal history 09/25/2021: Anthony Reilly is a 75 y.o. male here as requested by Lorene Dy, MD for neuropathy.  He has a past medical history of high cholesterol, vertigo, chronic ITP, lacunar infarct.  I saw patient this past December for an incidental remote lacunar infarct found after he had a vertigo attack nothing acute.  Hemoglobin A1c was 5.4.  Images of his blood vessels showed no large vessel disease but did show mild chronic microvascular ischemic changes.  And of course the chronic infarct of the left caudate.  Patient says approximately 4-5 or maybe 6 months ago he began feeling discomfort in his feet and lower legs, mild tingling and mild numbness as opposed to pain, he currently feels it most days to varying degrees.  It moves around from the feet to the lower legs, on rare occasions the discomfort is felt up to the knees, sometimes he feels he has 50% numbness but most of the times it is not that high, has not affected his day-to-day activities, more of an aggravation than anything else, some years ago may be 15 roughly Dr. Mancel Bale prescribed a statin cholesterol drug intended to lower his overall cholesterol levels within a period of a few weeks after he started taking the drug he began having sensation similar to those described, he maintained his daily dosage even though he reported that to Dr. Mancel Bale, the sensations continued, he stopped taking the drug and perhaps a week the daily sensations went away and did not return, after taking the drug again within perhaps a week the sensations returned, he again stopped taking the drug and the sensations disappeared.  He did this multiple times.  He was placed on a different statin cholesterol drug and the problem was resolved and never returned.  In late December 2020 he  suffered a severe vertigo attack and was transported by EMS to [facility], and MRI revealed he had suffered several silent strokes, he was examined by me, blood thinner was started by the name of rosuvastatin.  His 63 year old sister suffers from the disease or something very similar.  Its onset began perhaps 10 years ago.  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  From January 69 through 20 1970, his company frequently serviced Scientist, water quality support bases located deep in [employer], on many occasions he drew 3 areas of dead trees which he learned some years later were killed by the difficulty in [place] acknowledges that anyone who said foot and self-care now has been exposed to agent orange to varying degrees.  HPI:  Anthony Reilly is a 75 y.o. male here as requested by Lorene Dy, MD for dizzines. PMHx vertigo, high cholesterol. Patient having dizzy spells, was seen at th emergency room and MRA showed remote strokes. Vertigo started 5-7 years ago he bent down and the room started spinning, since then he has had multiple episodes one most recently December 29th which was severe and sent him to the emergency room. He was started on Plavix recently after stroke found on MRI of the brain.  I reviewed a 3 page typed history  note from patient, he has been told he has some scar tissue inside one of his ears, the scarring probably occurred as a result of infant illness per his primary care, he denied balance issues dizziness, his first vertigo attack occurred perhaps 5 to 7 years ago when he bent over, he immediately lost all sense of balance and fell hard to the floor, few days later there was a second occurrence, the diagnosis was vertigo which resolved in a few weeks.  He is worn  glasses for over 30 years, 18 months ago he began having occasional dizzy spells about the time I received new prescription glasses from the [place], glasses never seemed right, his first 2020 series vertigo incident occurred in January February, he was seated at a card table within a matter minute he suddenly developed blurred vision dizziness, he was recovered in about 30 minutes, since then he has had a dozen or more dizzy spells of varying degrees, usually occurring when he stood from a seated position but not always, at least twice of occurred when he cut his eyes sharply to the left while checking for oncoming vehicular traffic, resolved in 10 minutes or less, sometime around mid December 2020 he was reading for a book he was wearing his glasses, he gaze across the room experienced severe dizziness, corrected itself than 10 minutes, on Sunday, December 26 he experienced a mild case of dizziness just after he sat down in a chair which subsided in less than 5 minutes is worse vertigo attack by far was on Tuesday, December 22, 2019, he stood up, immediately experienced severe dizziness and started across the room and grabbed a TV consult for support, everything was spinning, he fell hard to the floor, paramedic was called, his blood pressure was elevated, they were transported to the [facility] ER, MRI of the brain showed a "silent stroke" he was actually told there were multiple of them in the emergency room.  We also reviewed all the information above during the appointment, patient also states in the appointment today he is never experienced anybody weakness, focal weakness, and is concerned because strokes run on his mother side as do Alzheimer's disease, also maternal uncles died from strokes at age 75 and 1.  He also reports he was diagnosed with ITP several years ago, he has some muscular neck disease, takes Aleve to combat the neck soreness, he is currently seeing chiropractor for this.  On January 2021 he  had an eye exam, and they told him that it may be due to his eyeglasses and he needed a "prism", since those new classes he has had no incidence of vertigo and feels as though this may have been contributory as well as his blood pressure medication which has been changed. No other focal neurologic deficits, associated symptoms, inciting events or modifiable factors.  Reviewed notes, labs and imaging from outside physicians, which showed:  Personally reviewed imaging of the brain which showed a remote lacunar infarct of the left caudate, incidental (not the cause of vertigo). I also reviewed images with Dr. Leonie Man, head of Stroke, who agreed.  Review of Systems: Patient complains of symptoms per HPI as well as the following symptoms: numbness and tingling, elevated blood pressure. Pertinent negatives and positives per HPI. All others negative.   Social History   Socioeconomic History   Marital status: Married    Spouse name: Not on file   Number of children: 2   Years of education: 15   Highest  education level: Not on file  Occupational History   Not on file  Tobacco Use   Smoking status: Former    Types: Cigarettes   Smokeless tobacco: Never   Tobacco comments:    smoked maybe a carton in his 41s so he quit.  Vaping Use   Vaping Use: Never used  Substance and Sexual Activity   Alcohol use: Not Currently   Drug use: Never   Sexual activity: Not on file  Other Topics Concern   Not on file  Social History Narrative   Lives at home with wife   Right handed   Caffeine: 3-4 diet pepsi cans per day   Social Determinants of Health   Financial Resource Strain: Not on file  Food Insecurity: Not on file  Transportation Needs: Not on file  Physical Activity: Not on file  Stress: Not on file  Social Connections: Not on file  Intimate Partner Violence: Not on file    Family History  Problem Relation Age of Onset   Alzheimer's disease Mother    Stroke Maternal Grandmother     Alzheimer's disease Maternal Grandmother    Stroke Maternal Uncle    Stroke Maternal Uncle     Past Medical History:  Diagnosis Date   Hiatal hernia    High cholesterol    Vertigo     Patient Active Problem List   Diagnosis Date Noted   Numbness and tingling of both feet 09/25/2021   Lacunar stroke (HCC) 02/08/2020   Chronic ITP (idiopathic thrombocytopenia) (HCC) 02/08/2020   Vertigo 02/08/2020    Past Surgical History:  Procedure Laterality Date   BACK SURGERY     KNEE SURGERY     SHOULDER SURGERY      Current Outpatient Medications  Medication Sig Dispense Refill   clopidogrel (PLAVIX) 75 MG tablet Take 75 mg by mouth daily.     ezetimibe (ZETIA) 10 MG tablet Take 10 mg by mouth daily.     levothyroxine (SYNTHROID) 50 MCG tablet Take 50 mcg by mouth daily.     omeprazole (PRILOSEC) 40 MG capsule Take 40 mg by mouth every other day.      rosuvastatin (CRESTOR) 5 MG tablet Take 10 mg by mouth daily.     No current facility-administered medications for this visit.   Facility-Administered Medications Ordered in Other Visits  Medication Dose Route Frequency Provider Last Rate Last Admin   sodium chloride flush (NS) 0.9 % injection 18 mL  18 mL Intravenous PRN Anson Fret, MD        Allergies as of 09/25/2021 - Review Complete 09/25/2021  Allergen Reaction Noted   Penicillins Hives 12/23/2019    Vitals: BP (!) 143/82   Pulse 62   Ht 5\' 11"  (1.803 m)   Wt 204 lb (92.5 kg)   BMI 28.45 kg/m  Last Weight:  Wt Readings from Last 1 Encounters:  09/25/21 204 lb (92.5 kg)   Last Height:   Ht Readings from Last 1 Encounters:  09/25/21 5\' 11"  (1.803 m)   Exam: NAD, pleasant                  Speech:    Speech is normal; fluent and spontaneous with normal comprehension.  Cognition:    The patient is oriented to person, place, and time;     recent and remote memory intact;     language fluent;    Cranial Nerves:    The pupils are equal, round, and reactive  to light.Trigeminal sensation is intact and the muscles of mastication are normal. The face is symmetric. The palate elevates in the midline. Hearing intact. Voice is normal. Shoulder shrug is normal. The tongue has normal motion without fasciculations.   Coordination:  No dysmetria  Motor Observation:    No asymmetry, no atrophy, and no involuntary movements noted. Tone:    Normal muscle tone.     Strength:    Strength is V/V in the upper and lower limbs.      Sensation: intact to LT and pin prick, decreased distally to cold in the feet, decreased vibration and proprioception in the great toes     Assessment/Plan: Lovely 76 year old last seen for episodes of vertigo.  Personally reviewed imaging of the brain which showed a remote lacunar infarct of the left caudate, incidental (not the cause of vertigo). Patient here today for new onset neuropathy about 6 months when he started statin drug. In the past had neuropathy that stopped when he discontinued his statin; there is indeed literature linking statin drugs to peripheral polyneuropathy. I will test him for other causes of neuropathy, I would stop the statin for a month and monitor, also will order an emg/ncs but may be able to cancel if he improves or other cause found. (New medications about 6 months ago includes a new statin and plavix and thyroid medication)  Orders Placed This Encounter  Procedures   B12 and Folate Panel   Vitamin B6   Vitamin B1   ANA, IFA (with reflex)   Heavy metals, blood   Multiple Myeloma Panel (SPEP&IFE w/QIG)   NCV with EMG(electromyography)    I had a long d/w patient about remote stroke, risk for recurrent stroke/TIAs, personally independently reviewed imaging studies and stroke evaluation results and answered questions.Continue Plavix  for secondary stroke prevention and maintain strict control of hypertension with blood pressure goal below 130/90, diabetes with hemoglobin A1c goal below 6.5% and  lipids with LDL cholesterol goal below 70 mg/dL. I also advised the patient to eat a healthy diet with plenty of whole grains, cereals, fruits and vegetables, exercise regularly and maintain ideal body weight .Followup in the future as needed.     Cc: Lorene Dy, MD  Sarina Ill, MD  [facility] [address] [place], [place] [address]  Phone [phone] Fax [phone]  I spent 40 minutes of face-to-face and non-face-to-face time with patient on the  1. Numbness and tingling of both feet   2. Other polyneuropathy    diagnosis.  This included previsit chart review, lab review, study review, order entry, electronic health record documentation, patient education on the different diagnostic and therapeutic options, counseling and coordination of care, risks and benefits of management, compliance, or risk factor reduction

## 2021-09-26 ENCOUNTER — Other Ambulatory Visit (INDEPENDENT_AMBULATORY_CARE_PROVIDER_SITE_OTHER): Payer: Self-pay

## 2021-09-26 ENCOUNTER — Other Ambulatory Visit: Payer: Self-pay

## 2021-09-26 DIAGNOSIS — Z0289 Encounter for other administrative examinations: Secondary | ICD-10-CM

## 2021-10-01 LAB — HEAVY METALS, BLOOD
Arsenic: <1 ug/L (ref 0–9)
Lead, Blood: 1 ug/dL (ref 0–4)
Mercury: <1 ug/L (ref 0.0–14.9)

## 2021-10-01 LAB — MULTIPLE MYELOMA PANEL, SERUM
Albumin SerPl Elph-Mcnc: 4.3 g/dL (ref 2.9–4.4)
Albumin/Glob SerPl: 1.8 — ABNORMAL HIGH (ref 0.7–1.7)
Alpha 1: 0.2 g/dL (ref 0.0–0.4)
Alpha2 Glob SerPl Elph-Mcnc: 0.9 g/dL (ref 0.4–1.0)
B-Globulin SerPl Elph-Mcnc: 0.9 g/dL (ref 0.7–1.3)
Gamma Glob SerPl Elph-Mcnc: 0.4 g/dL (ref 0.4–1.8)
Globulin, Total: 2.4 g/dL (ref 2.2–3.9)
IgA/Immunoglobulin A, Serum: 33 mg/dL — ABNORMAL LOW (ref 61–437)
IgG (Immunoglobin G), Serum: 431 mg/dL — ABNORMAL LOW (ref 603–1613)
IgM (Immunoglobulin M), Srm: 13 mg/dL — ABNORMAL LOW (ref 15–143)
Total Protein: 6.7 g/dL (ref 6.0–8.5)

## 2021-10-01 LAB — ANTINUCLEAR ANTIBODIES, IFA: ANA Titer 1: NEGATIVE

## 2021-10-01 LAB — B12 AND FOLATE PANEL
Folate: >20 ng/mL (ref >3.0)
Vitamin B-12: 281 pg/mL (ref 232–1245)

## 2021-10-01 LAB — VITAMIN B6: Vitamin B6: 7 ug/L (ref 3.4–65.2)

## 2021-10-01 LAB — VITAMIN B1: Thiamine: 200.7 nmol/L — ABNORMAL HIGH (ref 66.5–200.0)

## 2021-10-02 ENCOUNTER — Other Ambulatory Visit: Payer: Self-pay | Admitting: Neurology

## 2021-10-02 DIAGNOSIS — E538 Deficiency of other specified B group vitamins: Secondary | ICD-10-CM

## 2021-10-02 DIAGNOSIS — R778 Other specified abnormalities of plasma proteins: Secondary | ICD-10-CM

## 2021-10-03 ENCOUNTER — Other Ambulatory Visit (INDEPENDENT_AMBULATORY_CARE_PROVIDER_SITE_OTHER): Payer: Self-pay

## 2021-10-03 DIAGNOSIS — R778 Other specified abnormalities of plasma proteins: Secondary | ICD-10-CM

## 2021-10-03 DIAGNOSIS — E538 Deficiency of other specified B group vitamins: Secondary | ICD-10-CM

## 2021-10-03 DIAGNOSIS — Z0289 Encounter for other administrative examinations: Secondary | ICD-10-CM

## 2021-10-11 ENCOUNTER — Telehealth: Payer: Self-pay | Admitting: *Deleted

## 2021-10-11 LAB — MULTIPLE MYELOMA PANEL, SERUM
Albumin SerPl Elph-Mcnc: 4 g/dL (ref 2.9–4.4)
Albumin/Glob SerPl: 1.9 — ABNORMAL HIGH (ref 0.7–1.7)
Alpha 1: 0.2 g/dL (ref 0.0–0.4)
Alpha2 Glob SerPl Elph-Mcnc: 0.9 g/dL (ref 0.4–1.0)
B-Globulin SerPl Elph-Mcnc: 0.8 g/dL (ref 0.7–1.3)
Gamma Glob SerPl Elph-Mcnc: 0.3 g/dL — ABNORMAL LOW (ref 0.4–1.8)
Globulin, Total: 2.2 g/dL (ref 2.2–3.9)
IgA/Immunoglobulin A, Serum: 28 mg/dL — ABNORMAL LOW (ref 61–437)
IgG (Immunoglobin G), Serum: 407 mg/dL — ABNORMAL LOW (ref 603–1613)
IgM (Immunoglobulin M), Srm: 11 mg/dL — ABNORMAL LOW (ref 15–143)
Total Protein: 6.2 g/dL (ref 6.0–8.5)

## 2021-10-11 LAB — METHYLMALONIC ACID, SERUM: Methylmalonic Acid: 322 nmol/L (ref 0–378)

## 2021-10-11 LAB — VITAMIN B12: Vitamin B-12: 258 pg/mL (ref 232–1245)

## 2021-10-11 NOTE — Telephone Encounter (Addendum)
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 I am going to run it by one of my hematology colleagues and let you know if you should be seen,

## 2021-10-16 ENCOUNTER — Telehealth: Payer: Self-pay | Admitting: *Deleted

## 2021-10-16 NOTE — Telephone Encounter (Signed)
-----  Message from Melvenia Beam, MD sent at 10/15/2021  9:05 PM EDT ----- I sent a message to hematology about patient. 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 Since this is chronic and was evaluated in the past I don;t see a reason to send him back to hematology and would ask him to discuss with primary care. thanks

## 2021-10-16 NOTE — Telephone Encounter (Signed)
Result note, sent to pt by mychart email which he did see.

## 2021-11-02 ENCOUNTER — Ambulatory Visit (INDEPENDENT_AMBULATORY_CARE_PROVIDER_SITE_OTHER): Payer: Medicare Other | Admitting: Neurology

## 2021-11-02 ENCOUNTER — Other Ambulatory Visit: Payer: Self-pay

## 2021-11-02 ENCOUNTER — Other Ambulatory Visit: Payer: Self-pay | Admitting: Neurology

## 2021-11-02 ENCOUNTER — Ambulatory Visit: Payer: Medicare Other | Admitting: Neurology

## 2021-11-02 DIAGNOSIS — R7309 Other abnormal glucose: Secondary | ICD-10-CM

## 2021-11-02 DIAGNOSIS — R202 Paresthesia of skin: Secondary | ICD-10-CM

## 2021-11-02 DIAGNOSIS — R200 Anesthesia of skin: Secondary | ICD-10-CM

## 2021-11-02 DIAGNOSIS — G6289 Other specified polyneuropathies: Secondary | ICD-10-CM

## 2021-11-02 NOTE — Progress Notes (Signed)
Full Name: Nasir Bright Gender: Male MRN #: [id number] Date of Birth: 11/01/1946    Visit Date: 11/02/2021 08:19 Age: 75 Years Examining Physician: Naomie Dean, MD  Primary Care Provider:  Burton Apley, MD Height: 5 feet 11 inch  Patient History:            Patient with numbness and tingling in feet/legs   Summary: Nerve conduction studies were performed in the bilateral lower extremities.  The left tibial F wave showed delayed latency (59.8 ms, normal less than 56).  The right tibial F wave showed delayed latency (65.1 ms, normal less than 56).  All remaining nerves (as indicated in the following tables) were within normal limits.  EMG needle study was performed on the lower right extremity.  All muscles (as indicated in the following tables) were within normal limits.        Conclusion: This is essentially a normal study of the lower extremities.  F waves showed slightly delayed latencies which can be normal for height and age or be due to remote radiculopathy given his clinical history.  No significant evidence for mononeuropathy, large-fiber polyneuropathy or acute/ongoing radiculopathy. Small fiber neuropathy can evade detection on this exam and patient is currently being thoroughly evaluated for any causes of polyneuropathy.    ------------------------------- Naomie Dean, M.D.  [facility] [address], [address] [place], [place] [address] Tel: [phone] Fax: [phone]  Verbal informed consent was obtained from the patient, patient was informed of potential risk of procedure, including bruising, bleeding, hematoma formation, infection, muscle weakness, muscle pain, numbness, among others.        MNC    Nerve / Sites Muscle Latency Ref. Amplitude Ref. Rel Amp Segments Distance Velocity Ref. Area    ms ms mV mV %  cm m/s m/s mVms  L Peroneal - EDB     Ankle EDB 4.8 ?6.5 2.1 ?2.0 100 Ankle - EDB 9   10.9     Fib head EDB 11.7  1.6  76 Fib  head - Ankle 30 44 ?44 10.4     Pop fossa EDB 13.9  1.6  99.6 Pop fossa - Fib head 10 44 ?44 7.0         Pop fossa - Ankle      R Peroneal - EDB     Ankle EDB 5.0 ?6.5 2.8 ?2.0 100 Ankle - EDB 9   16.5     Fib head EDB 11.8  2.2  78.2 Fib head - Ankle 30 44 ?44 13.5     Pop fossa EDB 14.1  2.4  111 Pop fossa - Fib head 10 44 ?44 12.2         Pop fossa - Ankle      L Tibial - AH     Ankle AH 5.7 ?5.8 6.6 ?4.0 100 Ankle - AH 9   14.9     Pop fossa AH 15.6  4.9  74.3 Pop fossa - Ankle 41 41 ?41 13.6  R Tibial - AH     Ankle AH 4.7 ?5.8 4.8 ?4.0 100 Ankle - AH 9   13.5     Pop fossa AH 15.0  3.9  80 Pop fossa - Ankle 42 41 ?41 13.3             SNC    Nerve / Sites Rec. Site Peak Lat Ref.  Amp Ref. Segments Distance    ms ms V V  cm  L Sural - Ankle (Calf)     Calf Ankle 3.9 ?4.4 6 ?6 Calf - Ankle 14  R Sural - Ankle (Calf)     Calf Ankle 3.7 ?4.4 8 ?6 Calf - Ankle 14  L Superficial peroneal - Ankle     Lat leg Ankle 3.4 ?4.4 6 ?6 Lat leg - Ankle 14  R Superficial peroneal - Ankle     Lat leg Ankle 3.9 ?4.4 6 ?6 Lat leg - Ankle 14             F  Wave    Nerve F Lat Ref.   ms ms  L Tibial - AH 59.8 ?56.0  R Tibial - AH 65.1 ?56.0         EMG Summary Table    Spontaneous MUAP Recruitment  Muscle IA Fib PSW Fasc Other Amp Dur. Poly Pattern  R. Vastus medialis Normal None None None _______ Normal Normal Normal Normal  R. Tibialis anterior Normal None None None _______ Normal Normal Normal Normal  R. Gastrocnemius (Medial head) Normal None None None _______ Normal Normal Normal Normal  R. Extensor hallucis longus Normal None None None _______ Normal Normal Normal Normal  R. Abductor hallucis Normal None None None _______ Normal Normal Normal Normal

## 2021-11-02 NOTE — Patient Instructions (Addendum)
-   He feels since stopping the statin he is stable or improved in the sensory changes in the feet. Also since stopping the statin his neck pain went away and his muscle pain in the neck got better but he also saw a chiropractor. Suggest restarting the statin and if the neck pain gets worse or the symptoms in the legs start worsening then have appointment with primary care Dr. Mancel Bale in February I would discuss trying Praluent and Repatha instead of statins. Check HgbA1c today.   - IFE/SPEP showed hypogammaglobulinemia.  I contacted hematology who states this is a chronic issue, patient was seen 10 years prior, has longstanding thrombocytopenia and hypogammaglobulinemia, had work-up and had a bone marrow biopsy which was negative in 2002, hematology recommended unless he is having new symptoms no follow-up was needed. Suggest mentioning to primary care at next appointmnet.   - B12 was low normal at 258, methylmalonic acid was high normal, I did recommend increasing foods with B12 or taking a B12 supplement.  - follow clinically

## 2021-11-02 NOTE — Progress Notes (Signed)
History: Patient is here today for emg/ncs. Patient says approximately 4-5 or maybe 6 months ago he began feeling discomfort in his feet and lower legs, mild tingling and mild numbness as opposed to pain, he currently feels it most days to varying degrees.  It moves around from the feet to the lower legs, on rare occasions the discomfort is felt up to the knees, sometimes he feels he has 50% numbness but most of the times it is not that high, has not affected his day-to-day activities, more of an aggravation than anything else.  He has back pain and stiffness, he had a hx of sciatic pain s/p surgery in 1998 was excruciating. That likely why the prolonged f waves. He feels since stopping the statin he is stable or improved in the sensory changes in the feet. Also since stopping the statin his neck pain went away and his muscle pain in the neck got better but he also saw a chiropractor. Suggest restart the statin and if the neck pain gets worse or the symptoms in the legs start worsening then at appointment with primary care Dr. Mancel Bale in February I would discuss trying Praluent and Repatha instead of statins. Check HgbA1c today( has been normal in the past)  I reviewed labs and emg/ncs results with patient today, IFE/SPEP showed hypogammaglobulinemia.  I contacted hematology who states this is a chronic issue, patient was seen 10 years prior, has longstanding thrombocytopenia and hypogammaglobulinemia, had work-up and had a bone marrow biopsy which was negative in 2002, unless he is having new symptoms no follow-up was needed.  B12 was low normal at 258, methylmalonic acid was high normal, I did recommend increasing foods with B12 or taking a B12 supplement.  B6, folate, thiamine, heavy metals, ANA all within normal limits. EMG/NCS was essentially normal, cannot rule out small-fiber neuropathy. Will see what happens with restarting statin; it is rare but there is literature inking statins to polyneuropathy.  I  spent 20 minutes of face-to-face and non-face-to-face time with patient on the  1. Numbness and tingling of both feet   2. screening for diabetes    diagnosis.  This included previsit chart review, lab review, study review, order entry, electronic health record documentation, patient education on the different diagnostic and therapeutic options, counseling and coordination of care, risks and benefits of management, compliance, or risk factor reduction. This does not include time spent on emg/ncs.

## 2021-11-03 LAB — HEMOGLOBIN A1C
Est. average glucose Bld gHb Est-mCnc: 111 mg/dL
Hgb A1c MFr Bld: 5.5 % (ref 4.8–5.6)

## 2021-11-07 NOTE — Procedures (Signed)
Full Name: Gilad Dugger Gender: Male MRN #: [id number] Date of Birth: Jul 31, 1946    Visit Date: 11/02/2021 08:19 Age: 75 Years Examining Physician: Naomie Dean, MD  Primary Care Provider:  Burton Apley, MD Height: 5 feet 11 inch  Patient History:            Patient with numbness and tingling in feet/legs   Summary: Nerve conduction studies were performed in the bilateral lower extremities.  The left tibial F wave showed delayed latency (59.8 ms, normal less than 56).  The right tibial F wave showed delayed latency (65.1 ms, normal less than 56).  All remaining nerves (as indicated in the following tables) were within normal limits.  EMG needle study was performed on the lower right extremity.  All muscles (as indicated in the following tables) were within normal limits.        Conclusion: This is essentially a normal study of the lower extremities.  F waves showed slightly delayed latencies which can be normal for height and age or be due to remote radiculopathy given his clinical history.  No significant evidence for mononeuropathy, large-fiber polyneuropathy or acute/ongoing radiculopathy. Small fiber neuropathy can evade detection on this exam and patient is currently being thoroughly evaluated for any causes of polyneuropathy.    ------------------------------- Naomie Dean, M.D.  [facility] [address] [place], [place] [address] Tel: [phone] Fax: [phone]  Verbal informed consent was obtained from the patient, patient was informed of potential risk of procedure, including bruising, bleeding, hematoma formation, infection, muscle weakness, muscle pain, numbness, among others.        MNC    Nerve / Sites Muscle Latency Ref. Amplitude Ref. Rel Amp Segments Distance Velocity Ref. Area    ms ms mV mV %  cm m/s m/s mVms  L Peroneal - EDB     Ankle EDB 4.8 ?6.5 2.1 ?2.0 100 Ankle - EDB 9   10.9     Fib head EDB 11.7  1.6  76 Fib  head - Ankle 30 44 ?44 10.4     Pop fossa EDB 13.9  1.6  99.6 Pop fossa - Fib head 10 44 ?44 7.0         Pop fossa - Ankle      R Peroneal - EDB     Ankle EDB 5.0 ?6.5 2.8 ?2.0 100 Ankle - EDB 9   16.5     Fib head EDB 11.8  2.2  78.2 Fib head - Ankle 30 44 ?44 13.5     Pop fossa EDB 14.1  2.4  111 Pop fossa - Fib head 10 44 ?44 12.2         Pop fossa - Ankle      L Tibial - AH     Ankle AH 5.7 ?5.8 6.6 ?4.0 100 Ankle - AH 9   14.9     Pop fossa AH 15.6  4.9  74.3 Pop fossa - Ankle 41 41 ?41 13.6  R Tibial - AH     Ankle AH 4.7 ?5.8 4.8 ?4.0 100 Ankle - AH 9   13.5     Pop fossa AH 15.0  3.9  80 Pop fossa - Ankle 42 41 ?41 13.3             SNC    Nerve / Sites Rec. Site Peak Lat Ref.  Amp Ref. Segments Distance    ms ms V V  cm  L Sural - Ankle (Calf)     Calf Ankle 3.9 ?4.4 6 ?6 Calf - Ankle 14  R Sural - Ankle (Calf)     Calf Ankle 3.7 ?4.4 8 ?6 Calf - Ankle 14  L Superficial peroneal - Ankle     Lat leg Ankle 3.4 ?4.4 6 ?6 Lat leg - Ankle 14  R Superficial peroneal - Ankle     Lat leg Ankle 3.9 ?4.4 6 ?6 Lat leg - Ankle 14             F  Wave    Nerve F Lat Ref.   ms ms  L Tibial - AH 59.8 ?56.0  R Tibial - AH 65.1 ?56.0         EMG Summary Table    Spontaneous MUAP Recruitment  Muscle IA Fib PSW Fasc Other Amp Dur. Poly Pattern  R. Vastus medialis Normal None None None _______ Normal Normal Normal Normal  R. Tibialis anterior Normal None None None _______ Normal Normal Normal Normal  R. Gastrocnemius (Medial head) Normal None None None _______ Normal Normal Normal Normal  R. Extensor hallucis longus Normal None None None _______ Normal Normal Normal Normal  R. Abductor hallucis Normal None None None _______ Normal Normal Normal Normal

## 2021-11-27 IMAGING — MR MR HEAD W/O CM
18 of 22 series · 37 of 48 positions shown · IV contrast (Contrast agent)
Comparison: None.

CLINICAL DATA: Dizziness

EXAM:
MRI HEAD WITHOUT CONTRAST
MRA HEAD WITHOUT CONTRAST
MRA NECK WITHOUT AND WITH CONTRAST
TECHNIQUE: Multiplanar, multiecho pulse sequences of the brain and surrounding
structures were obtained without and with intravenous contrast.
Angiographic images of the Circle of Willis were obtained using MRA
technique without intravenous contrast. Angiographic images of the
neck were obtained using MRA technique without and with intravenous
contrast. Carotid stenosis measurements (when applicable) are
obtained utilizing NASCET criteria, using the distal internal
carotid diameter as the denominator.
CONTRAST:  9mL GADAVIST GADOBUTROL 1 MMOL/ML IV SOLN

[Series 5: DWI · axial · 3.0mm · 0.88mm/px · z∈[-112,+40]mm · 2 of 104 slices shown (1 of 3)]
[im 1/104]
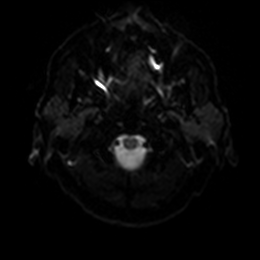
[im 104/104]
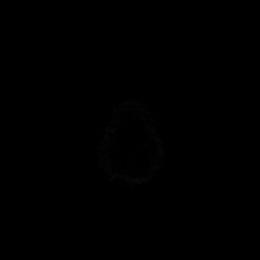

[Series 6: DWI · axial · 3.0mm · 0.88mm/px · 1 of 52 slices shown (2 of 3)]
[im 1/52]
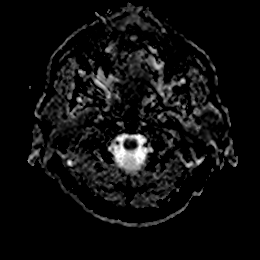

[Series 7: DWI · coronal · 4.0mm · 0.88mm/px · 1 of 64 slices shown (3 of 3)]
[im 1/64]
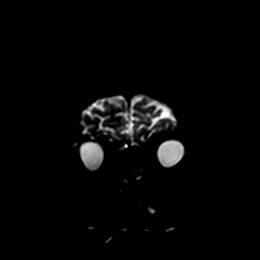

[Series 9: T1 · sagittal · 5.0mm · 0.75mm/px · 1 of 25 slices shown]
[im 1/25]
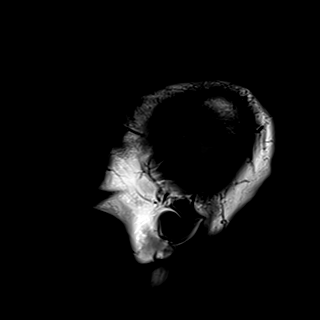

[Series 10: T2 · axial · 5.0mm · 0.72mm/px · 1 of 27 slices shown (1 of 2)]
[im 1/27]
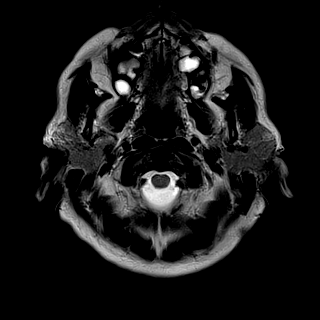

[Series 11: FLAIR · axial · 5.0mm · 0.45mm/px · 1 of 27 slices shown]
[im 1/27]
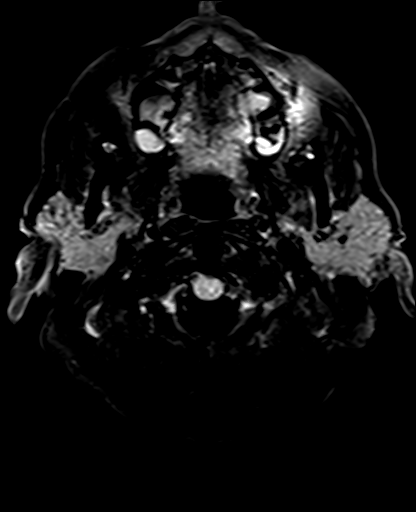

[Series 12: mag_images · axial · 3.0mm · 0.90mm/px · 1 of 60 slices shown]
[im 1/60]
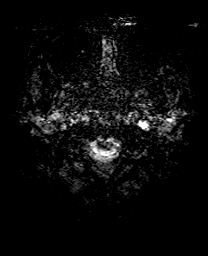

[Series 13: pha_images · axial · 3.0mm · 0.90mm/px · 1 of 58 slices shown]
[im 1/58]
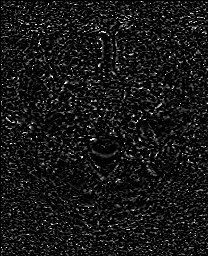

[Series 14: swi_images · axial · 3.0mm · 0.90mm/px · 1 of 60 slices shown]
[im 1/60]
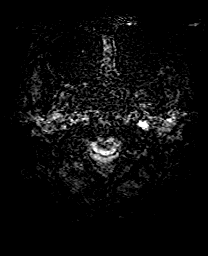

[Series 18: T2 · coronal · 5.0mm · 0.34mm/px · 1 of 29 slices shown (2 of 2)]
[im 1/29]
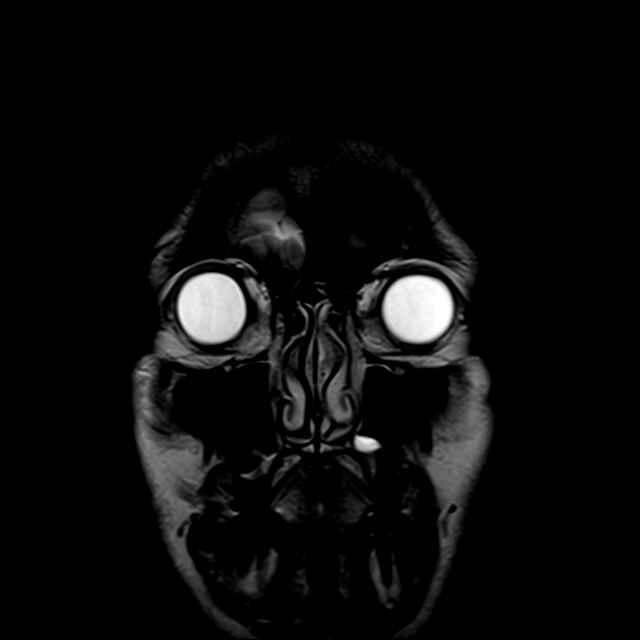

[Series 24: tof_fl3d_tra_iso · axial · 0.6mm · 0.52mm/px · z∈[-246,-132]mm · 5 of 195 slices shown]
[im 1/195]
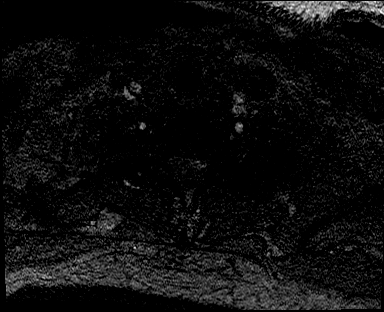
[im 49/195]
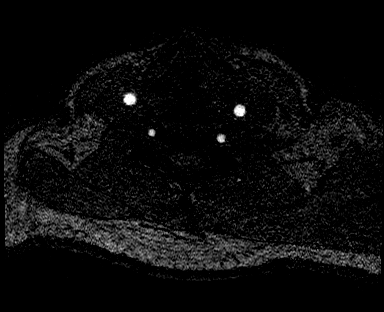
[im 98/195]
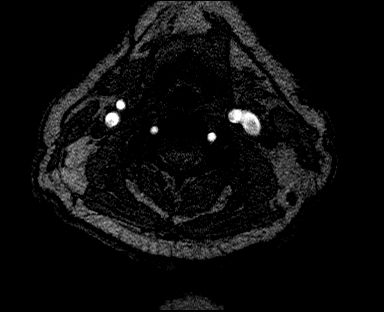
[im 146/195]
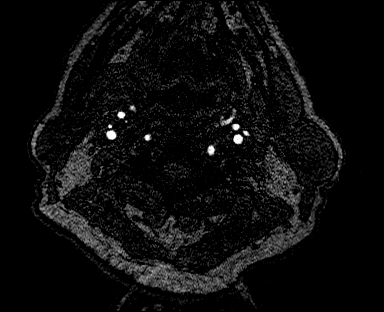
[im 195/195]
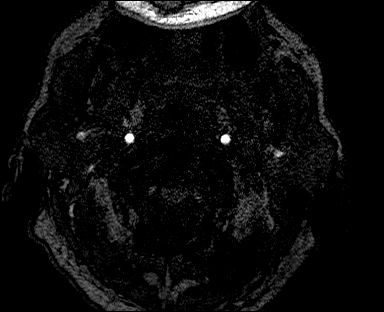

[Series 27: angio_fl3d_cor_pre_ttc=3.0s · coronal · 0.9mm · 0.85mm/px · 3 of 112 slices shown]
[im 1/112]
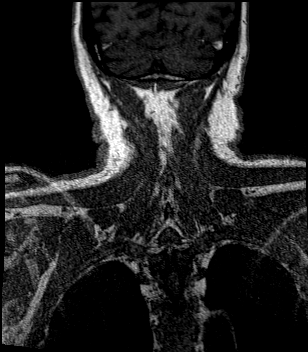
[im 56/112]
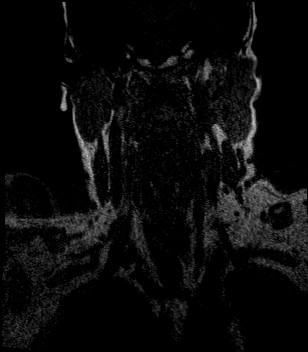
[im 112/112]
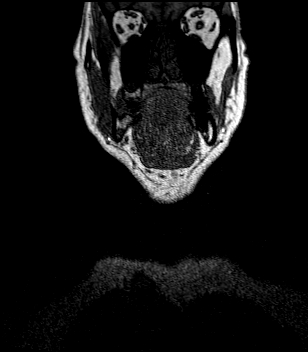

[Series 30: angio_fl3d_cor_post_ttc=3.0s · coronal · 0.9mm · 0.85mm/px · 3 of 108 slices shown (1 of 2)]
[im 1/108]
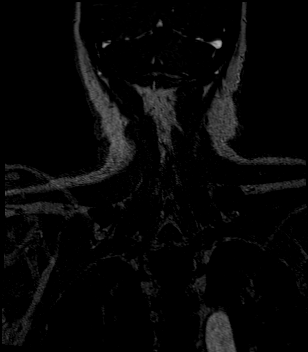
[im 54/108]
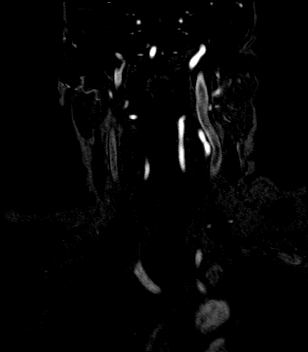
[im 108/108]
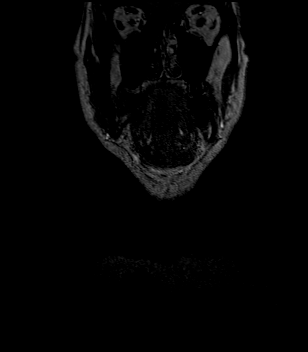

[Series 31: angio_fl3d_cor_post_ttc=3.0s_moco-adv · coronal · 0.9mm · 0.85mm/px · 3 of 106 slices shown (1 of 2)]
[im 1/106]
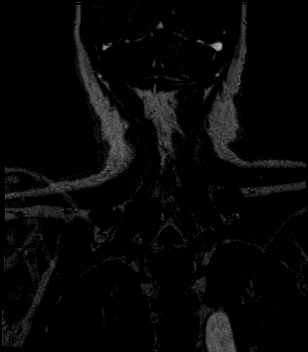
[im 53/106]
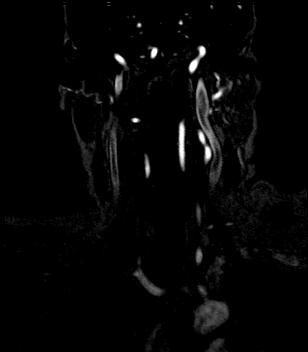
[im 106/106]
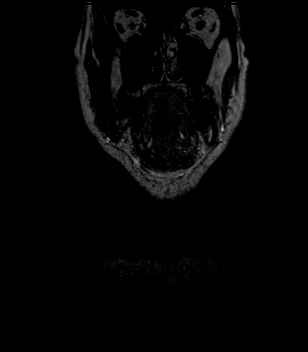

[Series 32: angio_fl3d_cor_post_ttc=3.0s_moco-adv_sub · coronal · 0.9mm · 0.85mm/px · 3 of 108 slices shown (1 of 2)]
[im 1/108]
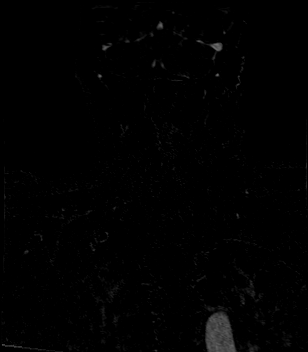
[im 54/108]
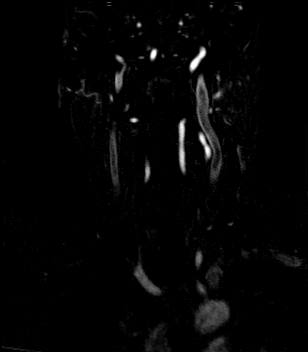
[im 108/108]
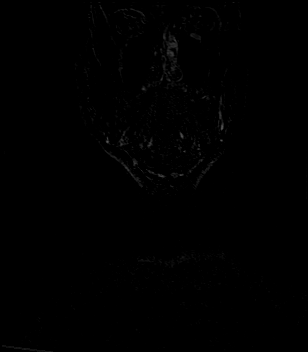

[Series 34: angio_fl3d_cor_post_ttc=3.0s · coronal · 0.9mm · 0.85mm/px · 3 of 112 slices shown (2 of 2)]
[im 1/112]
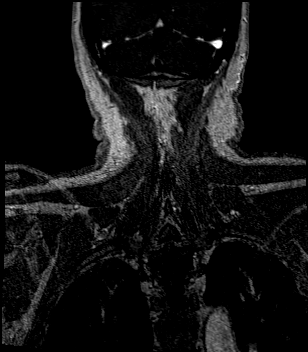
[im 56/112]
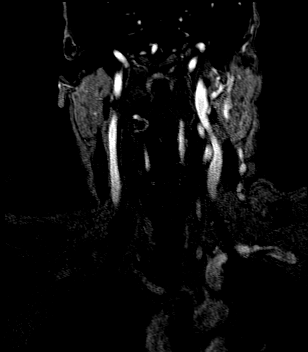
[im 112/112]
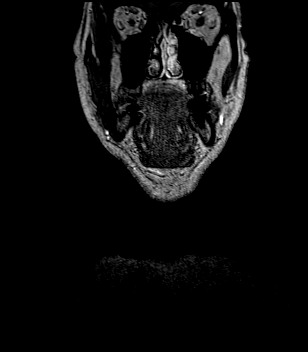

[Series 35: angio_fl3d_cor_post_ttc=3.0s_moco-adv · coronal · 0.9mm · 0.85mm/px · 3 of 112 slices shown (2 of 2)]
[im 1/112]
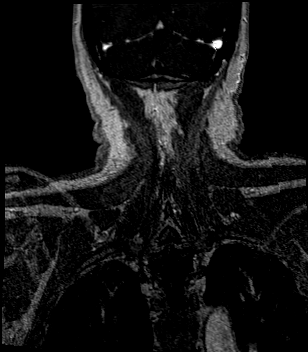
[im 56/112]
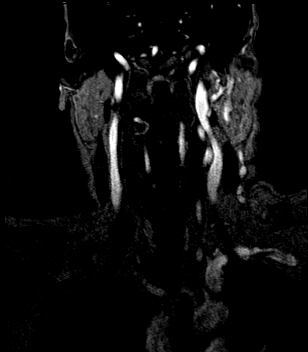
[im 112/112]
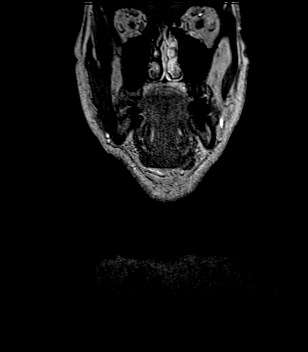

[Series 36: angio_fl3d_cor_post_ttc=3.0s_moco-adv_sub · coronal · 0.9mm · 0.85mm/px · 3 of 112 slices shown (2 of 2)]
[im 1/112]
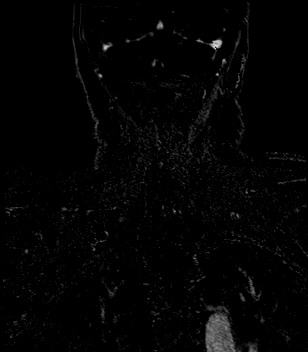
[im 56/112]
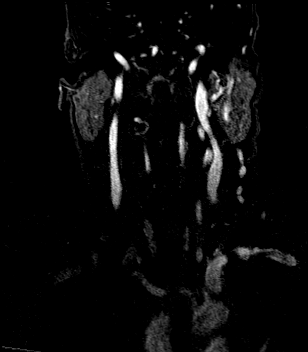
[im 112/112]
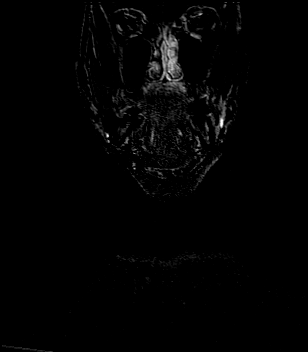

[37 of 48 positions shown; findings below may reference images not displayed]

FINDINGS: MRI HEAD FINDINGS

Brain: There is no acute infarction or intracranial hemorrhage.
There is no intracranial mass, mass effect, or edema. There is no
hydrocephalus or extra-axial fluid collection. Patchy T2
hyperintensity in the supratentorial white matter is nonspecific but
may reflect mild chronic microvascular ischemic changes. There is a
chronic small vessel infarct of the left caudate. Punctate focus of
susceptibility hypointensity in the right parietal subcortical white
matter likely reflects a chronic microhemorrhage.

Vascular: Major vessel flow voids at the skull base are preserved.

Skull and upper cervical spine: Normal marrow signal is preserved.

Sinuses/Orbits: Mild maxillary and ethmoid mucosal thickening.
Orbits are unremarkable.

Other: Sella is unremarkable.  Mastoid air cells are clear.

MRA HEAD FINDINGS

Images were inadvertently obtained after contrast administration.
Intracranial internal carotid arteries are patent. Middle and
anterior cerebral arteries are patent. Intracranial vertebral
arteries, basilar artery, posterior cerebral arteries are patent.
Posterior communicating artery is present on the right. There is no
significant stenosis or aneurysm.

MRA NECK FINDINGS

Common, internal, and external carotid arteries are patent. There is
no hemodynamically significant stenosis by NASCET criteria. No
evidence of dissection.

Extracranial vertebral arteries patent without stenosis or evidence
of dissection.
IMPRESSION: No acute infarction, intracranial hemorrhage, or mass. Mild chronic
microvascular ischemic changes and chronic infarct of the left
caudate.

No significant stenosis or evidence of dissection.

## 2021-11-27 IMAGING — MR MR MRA NECK WO/W CM
20 of 34 series · 25 of 48 positions shown · IV contrast (gadavist)
Comparison: None.

CLINICAL DATA: Dizziness

EXAM:
MRI HEAD WITHOUT CONTRAST
MRA HEAD WITHOUT CONTRAST
MRA NECK WITHOUT AND WITH CONTRAST
TECHNIQUE: Multiplanar, multiecho pulse sequences of the brain and surrounding
structures were obtained without and with intravenous contrast.
Angiographic images of the Circle of Willis were obtained using MRA
technique without intravenous contrast. Angiographic images of the
neck were obtained using MRA technique without and with intravenous
contrast. Carotid stenosis measurements (when applicable) are
obtained utilizing NASCET criteria, using the distal internal
carotid diameter as the denominator.
CONTRAST:  9mL GADAVIST GADOBUTROL 1 MMOL/ML IV SOLN

[Series 5: DWI · axial · 3.0mm · 0.88mm/px · 1 of 104 slices shown (1 of 4)]
[im 1/104]
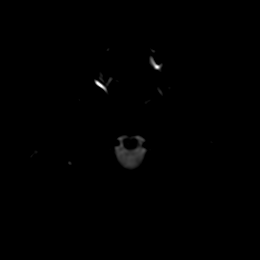

[Series 6: DWI · axial · 3.0mm · 0.88mm/px · 1 of 52 slices shown (2 of 4)]
[im 1/52]
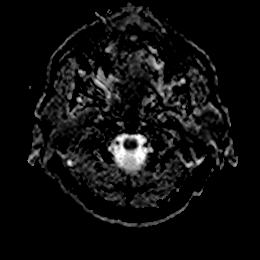

[Series 7: DWI · coronal · 4.0mm · 0.88mm/px · 1 of 64 slices shown (3 of 4)]
[im 1/64]
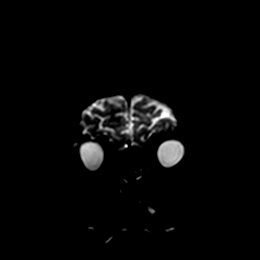

[Series 8: DWI · coronal · 4.0mm · 0.88mm/px · 1 of 32 slices shown (4 of 4)]
[im 1/32]
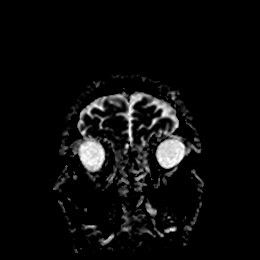

[Series 9: T1 · sagittal · 5.0mm · 0.75mm/px · 1 of 25 slices shown]
[im 1/25]
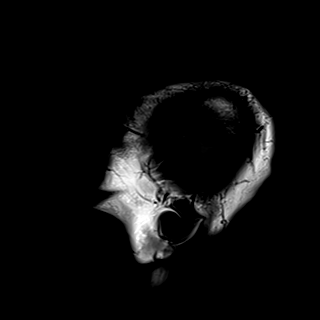

[Series 10: T2 · axial · 5.0mm · 0.72mm/px · 1 of 27 slices shown (1 of 2)]
[im 1/27]
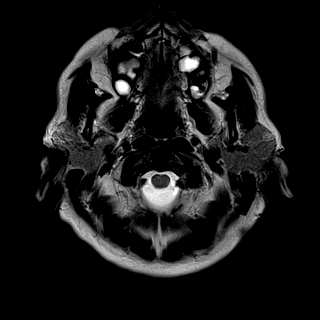

[Series 11: FLAIR · axial · 5.0mm · 0.45mm/px · 1 of 27 slices shown]
[im 1/27]
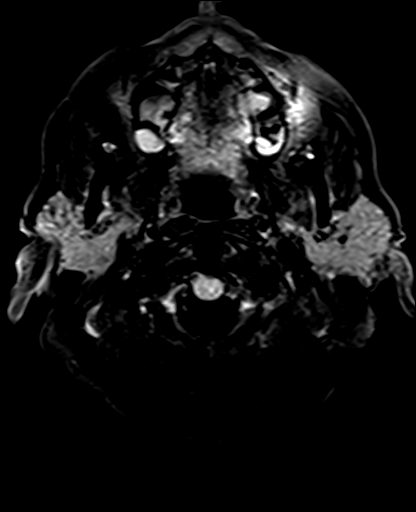

[Series 12: mag_images · axial · 3.0mm · 0.90mm/px · 1 of 60 slices shown]
[im 1/60]
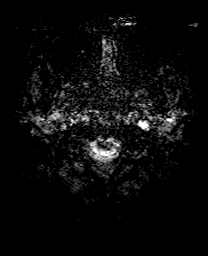

[Series 13: pha_images · axial · 3.0mm · 0.90mm/px · 1 of 58 slices shown]
[im 1/58]
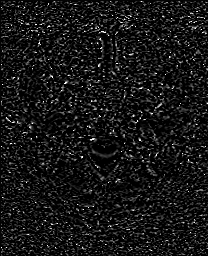

[Series 14: swi_images · axial · 3.0mm · 0.90mm/px · 1 of 60 slices shown]
[im 1/60]
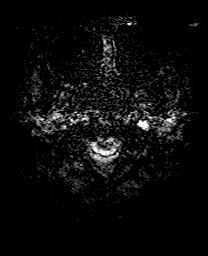

[Series 15: mip_images(sw) · axial · 24.0mm · 0.90mm/px · 1 of 53 slices shown]
[im 1/53]
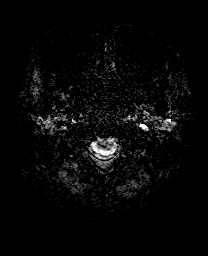

[Series 18: T2 · coronal · 5.0mm · 0.34mm/px · 1 of 29 slices shown (2 of 2)]
[im 1/29]
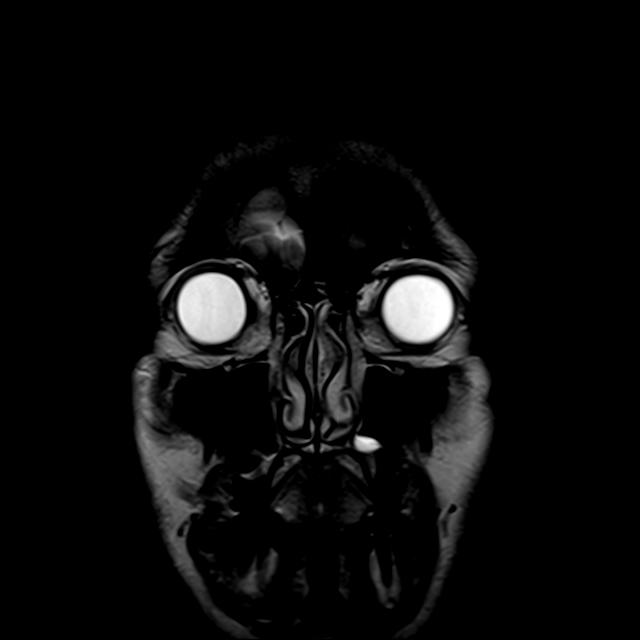

[Series 24: tof_fl3d_tra_iso · axial · 0.6mm · 0.52mm/px · z∈[-246,-132]mm · 3 of 195 slices shown]
[im 1/195]
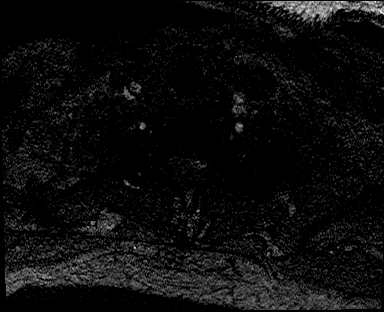
[im 98/195]
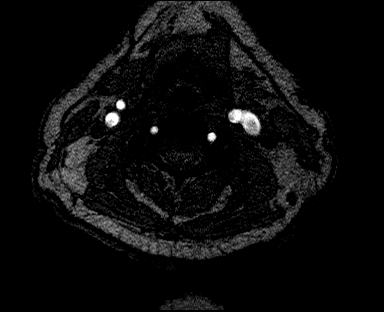
[im 195/195]
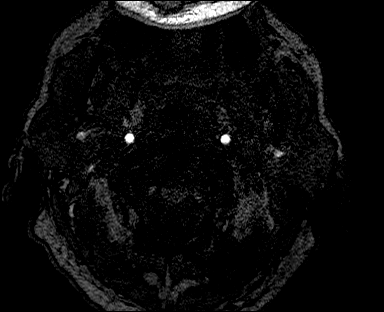

[Series 27: angio_fl3d_cor_pre_ttc=3.0s · coronal · 0.9mm · 0.85mm/px · 1 of 112 slices shown]
[im 1/112]
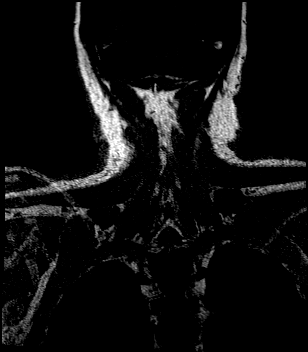

[Series 30: angio_fl3d_cor_post_ttc=3.0s · coronal · 0.9mm · 0.85mm/px · 1 of 108 slices shown (1 of 2)]
[im 1/108]
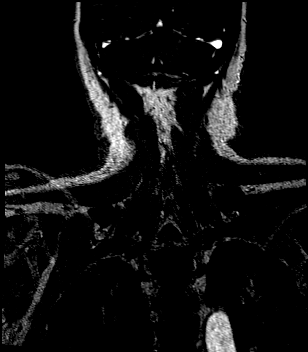

[Series 31: angio_fl3d_cor_post_ttc=3.0s_moco-adv · coronal · 0.9mm · 0.85mm/px · 1 of 106 slices shown (1 of 2)]
[im 1/106]
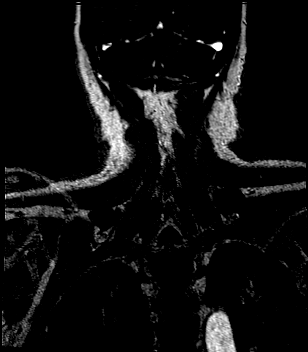

[Series 32: angio_fl3d_cor_post_ttc=3.0s_moco-adv_sub · coronal · 0.9mm · 0.85mm/px · 1 of 108 slices shown (1 of 2)]
[im 1/108]
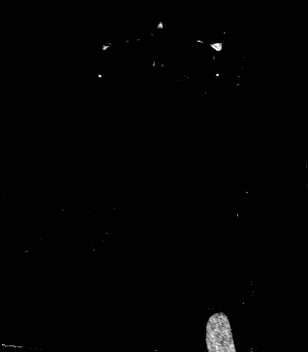

[Series 34: angio_fl3d_cor_post_ttc=3.0s · coronal · 0.9mm · 0.85mm/px · 2 of 112 slices shown (2 of 2)]
[im 1/112]
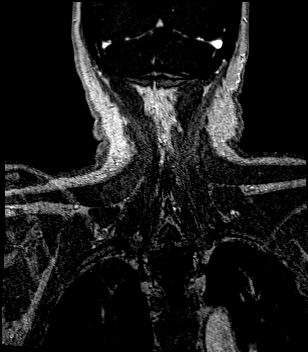
[im 112/112]
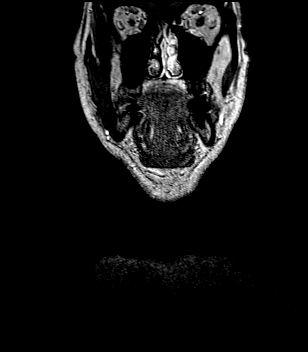

[Series 35: angio_fl3d_cor_post_ttc=3.0s_moco-adv · coronal · 0.9mm · 0.85mm/px · 2 of 112 slices shown (2 of 2)]
[im 1/112]
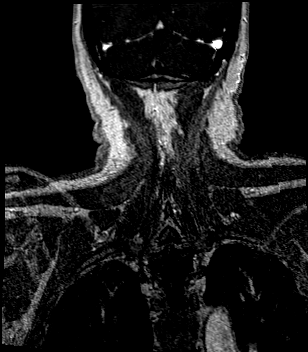
[im 112/112]
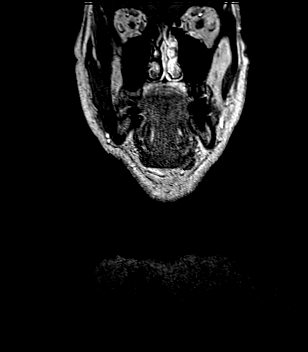

[Series 36: angio_fl3d_cor_post_ttc=3.0s_moco-adv_sub · coronal · 0.9mm · 0.85mm/px · 2 of 112 slices shown (2 of 2)]
[im 1/112]
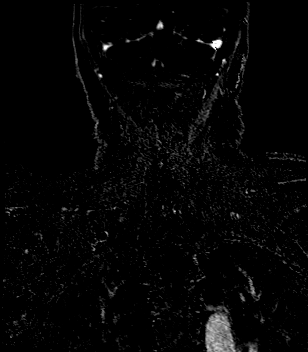
[im 112/112]
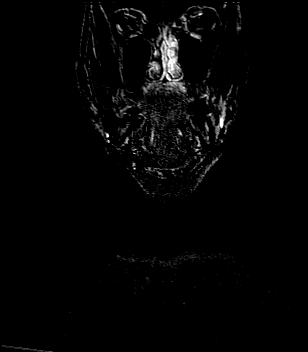

[25 of 48 positions shown; findings below may reference images not displayed]

FINDINGS: MRI HEAD FINDINGS

Brain: There is no acute infarction or intracranial hemorrhage.
There is no intracranial mass, mass effect, or edema. There is no
hydrocephalus or extra-axial fluid collection. Patchy T2
hyperintensity in the supratentorial white matter is nonspecific but
may reflect mild chronic microvascular ischemic changes. There is a
chronic small vessel infarct of the left caudate. Punctate focus of
susceptibility hypointensity in the right parietal subcortical white
matter likely reflects a chronic microhemorrhage.

Vascular: Major vessel flow voids at the skull base are preserved.

Skull and upper cervical spine: Normal marrow signal is preserved.

Sinuses/Orbits: Mild maxillary and ethmoid mucosal thickening.
Orbits are unremarkable.

Other: Sella is unremarkable.  Mastoid air cells are clear.

MRA HEAD FINDINGS

Images were inadvertently obtained after contrast administration.
Intracranial internal carotid arteries are patent. Middle and
anterior cerebral arteries are patent. Intracranial vertebral
arteries, basilar artery, posterior cerebral arteries are patent.
Posterior communicating artery is present on the right. There is no
significant stenosis or aneurysm.

MRA NECK FINDINGS

Common, internal, and external carotid arteries are patent. There is
no hemodynamically significant stenosis by NASCET criteria. No
evidence of dissection.

Extracranial vertebral arteries patent without stenosis or evidence
of dissection.
IMPRESSION: No acute infarction, intracranial hemorrhage, or mass. Mild chronic
microvascular ischemic changes and chronic infarct of the left
caudate.

No significant stenosis or evidence of dissection.
